# Patient Record
Sex: Female | Born: 1940
Health system: Southern US, Community
[De-identification: ages and names within clinical notes are randomized; demographics above are authoritative.]

## PROBLEM LIST (undated history)

## (undated) DIAGNOSIS — M1990 Unspecified osteoarthritis, unspecified site: Secondary | ICD-10-CM

## (undated) DIAGNOSIS — D649 Anemia, unspecified: Secondary | ICD-10-CM

## (undated) HISTORY — PX: EYE SURGERY: SHX253

## (undated) HISTORY — PX: TOTAL ABDOMINAL HYSTERECTOMY: SHX209

## (undated) HISTORY — PX: MOHS SURGERY: SHX181

---

## 1994-02-15 DIAGNOSIS — C4491 Basal cell carcinoma of skin, unspecified: Secondary | ICD-10-CM

## 1994-02-15 HISTORY — DX: Basal cell carcinoma of skin, unspecified: C44.91

## 1999-12-08 ENCOUNTER — Other Ambulatory Visit: Admission: RE | Admit: 1999-12-08 | Discharge: 1999-12-08 | Payer: Self-pay | Admitting: Gynecology

## 2002-12-15 ENCOUNTER — Other Ambulatory Visit: Admission: RE | Admit: 2002-12-15 | Discharge: 2002-12-15 | Payer: Self-pay | Admitting: Gynecology

## 2009-09-22 DIAGNOSIS — C4492 Squamous cell carcinoma of skin, unspecified: Secondary | ICD-10-CM

## 2009-09-22 HISTORY — DX: Squamous cell carcinoma of skin, unspecified: C44.92

## 2010-06-07 ENCOUNTER — Other Ambulatory Visit: Payer: Self-pay | Admitting: Gynecology

## 2010-06-07 DIAGNOSIS — R928 Other abnormal and inconclusive findings on diagnostic imaging of breast: Secondary | ICD-10-CM

## 2010-06-10 ENCOUNTER — Ambulatory Visit
Admission: RE | Admit: 2010-06-10 | Discharge: 2010-06-10 | Disposition: A | Discharge status: Home / Self Care | Payer: Medicare Other | Source: Ambulatory Visit | Attending: Gynecology | Admitting: Gynecology

## 2010-06-10 DIAGNOSIS — R928 Other abnormal and inconclusive findings on diagnostic imaging of breast: Secondary | ICD-10-CM

## 2011-01-24 ENCOUNTER — Other Ambulatory Visit (HOSPITAL_COMMUNITY): Payer: Self-pay | Admitting: Family Medicine

## 2011-01-24 DIAGNOSIS — Z139 Encounter for screening, unspecified: Secondary | ICD-10-CM

## 2011-01-27 ENCOUNTER — Ambulatory Visit (HOSPITAL_COMMUNITY)
Admission: RE | Admit: 2011-01-27 | Discharge: 2011-01-27 | Disposition: A | Discharge status: Home / Self Care | Payer: Medicare Other | Source: Ambulatory Visit | Attending: Family Medicine | Admitting: Family Medicine

## 2011-01-27 DIAGNOSIS — M899 Disorder of bone, unspecified: Secondary | ICD-10-CM | POA: Insufficient documentation

## 2011-01-27 DIAGNOSIS — Z13820 Encounter for screening for osteoporosis: Secondary | ICD-10-CM | POA: Insufficient documentation

## 2011-01-27 DIAGNOSIS — Z139 Encounter for screening, unspecified: Secondary | ICD-10-CM

## 2011-01-27 DIAGNOSIS — Z780 Asymptomatic menopausal state: Secondary | ICD-10-CM | POA: Insufficient documentation

## 2014-07-01 ENCOUNTER — Other Ambulatory Visit (HOSPITAL_COMMUNITY): Payer: Self-pay | Admitting: Family Medicine

## 2014-07-01 DIAGNOSIS — M8580 Other specified disorders of bone density and structure, unspecified site: Secondary | ICD-10-CM

## 2014-07-07 ENCOUNTER — Ambulatory Visit (HOSPITAL_COMMUNITY)
Admission: RE | Admit: 2014-07-07 | Discharge: 2014-07-07 | Disposition: A | Discharge status: Home / Self Care | Payer: Commercial Managed Care - HMO | Source: Ambulatory Visit | Attending: Family Medicine | Admitting: Family Medicine

## 2014-07-07 DIAGNOSIS — M8580 Other specified disorders of bone density and structure, unspecified site: Secondary | ICD-10-CM | POA: Diagnosis present

## 2015-06-01 DIAGNOSIS — H5203 Hypermetropia, bilateral: Secondary | ICD-10-CM | POA: Diagnosis not present

## 2015-06-01 DIAGNOSIS — I10 Essential (primary) hypertension: Secondary | ICD-10-CM | POA: Diagnosis not present

## 2015-06-01 DIAGNOSIS — H35033 Hypertensive retinopathy, bilateral: Secondary | ICD-10-CM | POA: Diagnosis not present

## 2015-06-01 DIAGNOSIS — H52223 Regular astigmatism, bilateral: Secondary | ICD-10-CM | POA: Diagnosis not present

## 2015-07-05 DIAGNOSIS — I10 Essential (primary) hypertension: Secondary | ICD-10-CM | POA: Diagnosis not present

## 2015-07-05 DIAGNOSIS — R946 Abnormal results of thyroid function studies: Secondary | ICD-10-CM | POA: Diagnosis not present

## 2015-07-05 DIAGNOSIS — E782 Mixed hyperlipidemia: Secondary | ICD-10-CM | POA: Diagnosis not present

## 2015-07-05 DIAGNOSIS — Z0000 Encounter for general adult medical examination without abnormal findings: Secondary | ICD-10-CM | POA: Diagnosis not present

## 2015-07-05 DIAGNOSIS — Z1389 Encounter for screening for other disorder: Secondary | ICD-10-CM | POA: Diagnosis not present

## 2015-07-05 DIAGNOSIS — Z6820 Body mass index (BMI) 20.0-20.9, adult: Secondary | ICD-10-CM | POA: Diagnosis not present

## 2015-08-05 ENCOUNTER — Ambulatory Visit (HOSPITAL_COMMUNITY)
Admission: RE | Admit: 2015-08-05 | Discharge: 2015-08-05 | Disposition: A | Discharge status: Home / Self Care | Payer: PPO | Source: Ambulatory Visit | Attending: Family Medicine | Admitting: Family Medicine

## 2015-08-05 ENCOUNTER — Other Ambulatory Visit (HOSPITAL_COMMUNITY): Payer: Self-pay | Admitting: Family Medicine

## 2015-08-05 DIAGNOSIS — M79672 Pain in left foot: Secondary | ICD-10-CM | POA: Diagnosis not present

## 2015-08-05 DIAGNOSIS — Z1389 Encounter for screening for other disorder: Secondary | ICD-10-CM | POA: Diagnosis not present

## 2015-08-05 DIAGNOSIS — Z6820 Body mass index (BMI) 20.0-20.9, adult: Secondary | ICD-10-CM | POA: Diagnosis not present

## 2015-08-17 DIAGNOSIS — R300 Dysuria: Secondary | ICD-10-CM | POA: Diagnosis not present

## 2015-08-17 DIAGNOSIS — Z1231 Encounter for screening mammogram for malignant neoplasm of breast: Secondary | ICD-10-CM | POA: Diagnosis not present

## 2015-08-17 DIAGNOSIS — N393 Stress incontinence (female) (male): Secondary | ICD-10-CM | POA: Diagnosis not present

## 2015-08-17 DIAGNOSIS — Z1212 Encounter for screening for malignant neoplasm of rectum: Secondary | ICD-10-CM | POA: Diagnosis not present

## 2015-08-17 DIAGNOSIS — Z01419 Encounter for gynecological examination (general) (routine) without abnormal findings: Secondary | ICD-10-CM | POA: Diagnosis not present

## 2015-08-17 DIAGNOSIS — N951 Menopausal and female climacteric states: Secondary | ICD-10-CM | POA: Diagnosis not present

## 2015-08-25 DIAGNOSIS — H25042 Posterior subcapsular polar age-related cataract, left eye: Secondary | ICD-10-CM | POA: Diagnosis not present

## 2015-08-25 DIAGNOSIS — H25012 Cortical age-related cataract, left eye: Secondary | ICD-10-CM | POA: Diagnosis not present

## 2015-08-25 DIAGNOSIS — I10 Essential (primary) hypertension: Secondary | ICD-10-CM | POA: Diagnosis not present

## 2015-08-25 DIAGNOSIS — H2512 Age-related nuclear cataract, left eye: Secondary | ICD-10-CM | POA: Diagnosis not present

## 2015-09-28 DIAGNOSIS — H2513 Age-related nuclear cataract, bilateral: Secondary | ICD-10-CM | POA: Diagnosis not present

## 2015-09-28 DIAGNOSIS — H2512 Age-related nuclear cataract, left eye: Secondary | ICD-10-CM | POA: Diagnosis not present

## 2015-09-29 DIAGNOSIS — H2511 Age-related nuclear cataract, right eye: Secondary | ICD-10-CM | POA: Diagnosis not present

## 2015-10-05 DIAGNOSIS — H2511 Age-related nuclear cataract, right eye: Secondary | ICD-10-CM | POA: Diagnosis not present

## 2015-10-06 DIAGNOSIS — Z961 Presence of intraocular lens: Secondary | ICD-10-CM | POA: Diagnosis not present

## 2016-06-06 DIAGNOSIS — C44519 Basal cell carcinoma of skin of other part of trunk: Secondary | ICD-10-CM | POA: Diagnosis not present

## 2016-06-06 DIAGNOSIS — D229 Melanocytic nevi, unspecified: Secondary | ICD-10-CM | POA: Diagnosis not present

## 2016-06-06 DIAGNOSIS — C44319 Basal cell carcinoma of skin of other parts of face: Secondary | ICD-10-CM | POA: Diagnosis not present

## 2016-06-06 DIAGNOSIS — L821 Other seborrheic keratosis: Secondary | ICD-10-CM | POA: Diagnosis not present

## 2016-06-06 DIAGNOSIS — Z85828 Personal history of other malignant neoplasm of skin: Secondary | ICD-10-CM | POA: Diagnosis not present

## 2016-07-20 DIAGNOSIS — C44519 Basal cell carcinoma of skin of other part of trunk: Secondary | ICD-10-CM | POA: Diagnosis not present

## 2016-07-20 DIAGNOSIS — C44319 Basal cell carcinoma of skin of other parts of face: Secondary | ICD-10-CM | POA: Diagnosis not present

## 2016-08-09 IMAGING — DX DG FOOT COMPLETE 3+V*L*
3 series · 3 of 3 positions shown · non-contrast
Comparison: None

CLINICAL DATA: Foot pain

EXAM:
LEFT FOOT - COMPLETE 3+ VIEW

[foot ap]
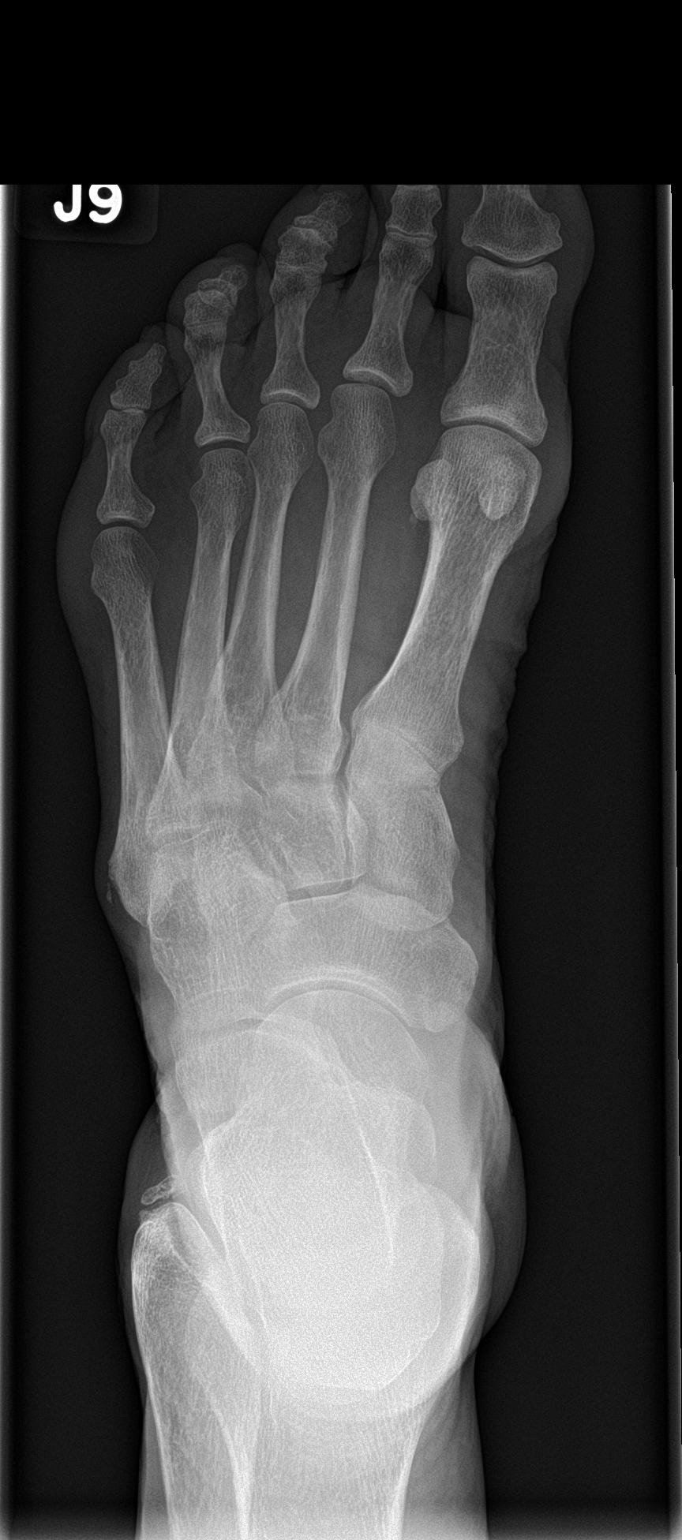

[foot obl]
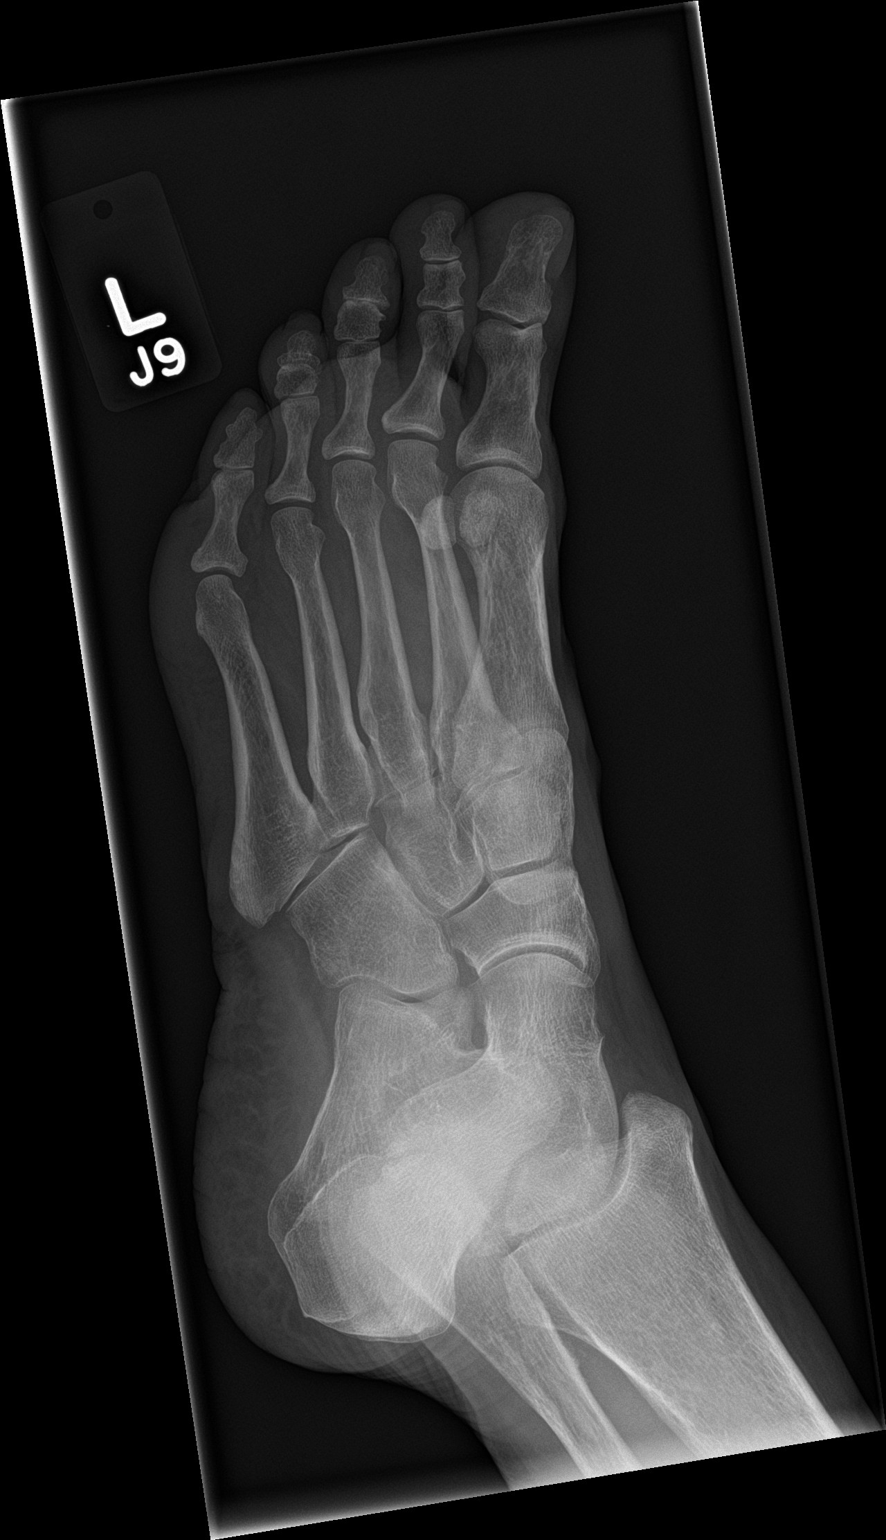

[foot lat]
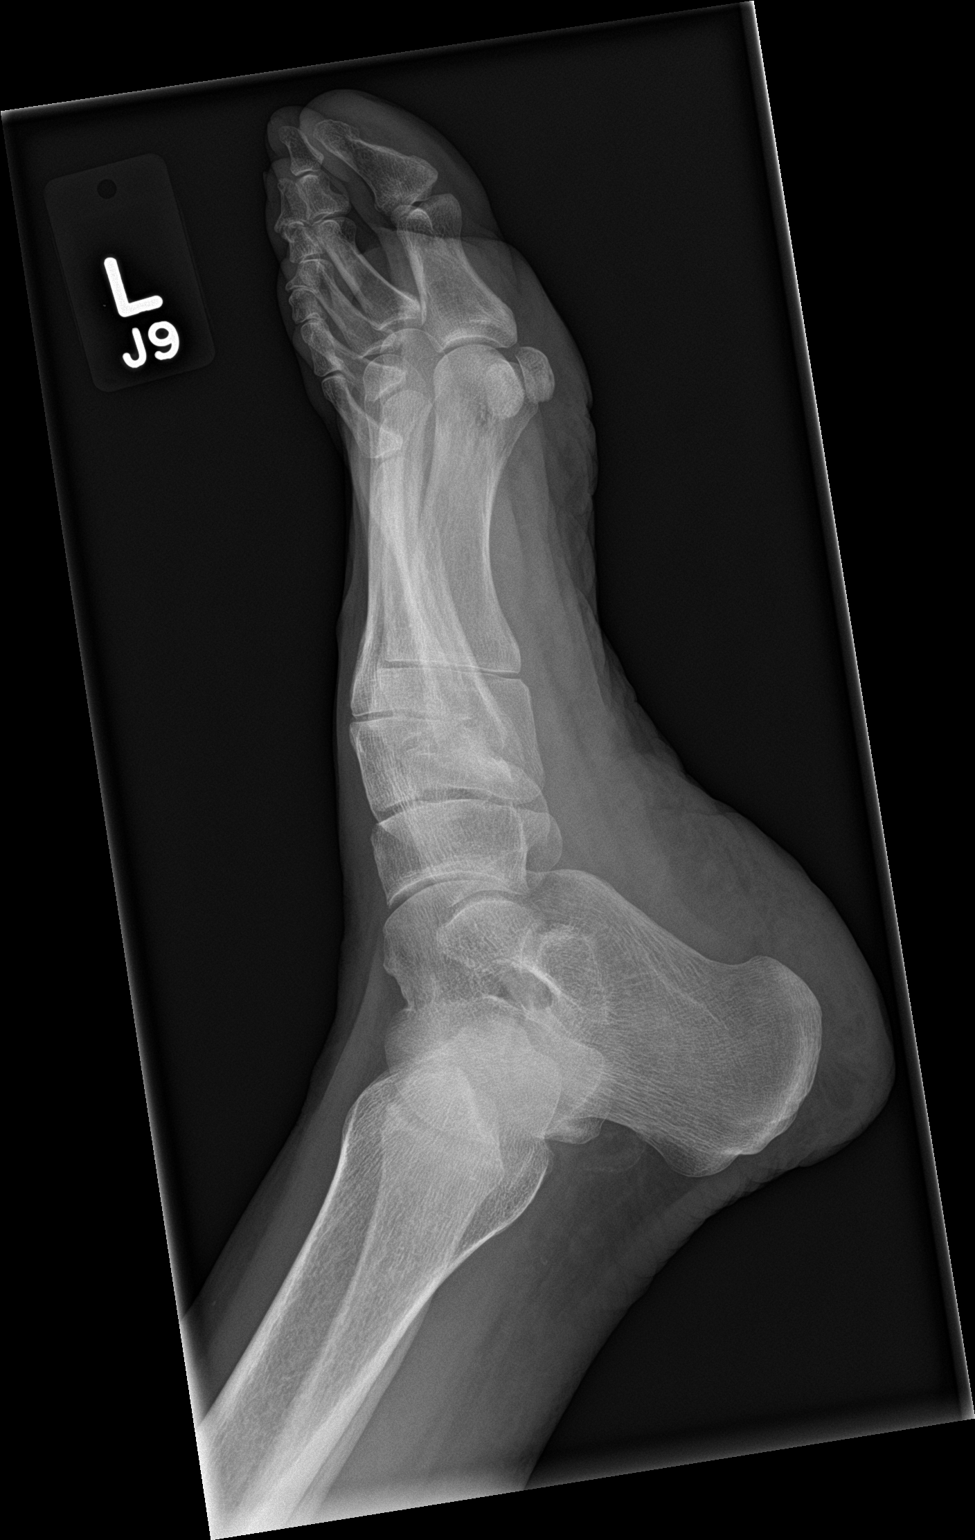

[3 of 3 positions shown; findings below may reference images not displayed]

FINDINGS: There is no evidence of fracture or dislocation. There is no
evidence of arthropathy or other focal bone abnormality. Soft
tissues are unremarkable.
IMPRESSION: Negative.

## 2016-08-21 DIAGNOSIS — Z1231 Encounter for screening mammogram for malignant neoplasm of breast: Secondary | ICD-10-CM | POA: Diagnosis not present

## 2016-08-23 ENCOUNTER — Other Ambulatory Visit: Payer: Self-pay | Admitting: Obstetrics & Gynecology

## 2016-08-23 DIAGNOSIS — R928 Other abnormal and inconclusive findings on diagnostic imaging of breast: Secondary | ICD-10-CM

## 2016-08-29 ENCOUNTER — Ambulatory Visit
Admission: RE | Admit: 2016-08-29 | Discharge: 2016-08-29 | Disposition: A | Discharge status: Home / Self Care | Payer: PPO | Source: Ambulatory Visit | Attending: Obstetrics & Gynecology | Admitting: Obstetrics & Gynecology

## 2016-08-29 DIAGNOSIS — R928 Other abnormal and inconclusive findings on diagnostic imaging of breast: Secondary | ICD-10-CM | POA: Diagnosis not present

## 2016-09-13 DIAGNOSIS — Z6821 Body mass index (BMI) 21.0-21.9, adult: Secondary | ICD-10-CM | POA: Diagnosis not present

## 2016-09-13 DIAGNOSIS — Z0000 Encounter for general adult medical examination without abnormal findings: Secondary | ICD-10-CM | POA: Diagnosis not present

## 2016-09-13 DIAGNOSIS — Z1389 Encounter for screening for other disorder: Secondary | ICD-10-CM | POA: Diagnosis not present

## 2017-01-22 DIAGNOSIS — L821 Other seborrheic keratosis: Secondary | ICD-10-CM | POA: Diagnosis not present

## 2017-01-22 DIAGNOSIS — L570 Actinic keratosis: Secondary | ICD-10-CM | POA: Diagnosis not present

## 2017-01-22 DIAGNOSIS — D229 Melanocytic nevi, unspecified: Secondary | ICD-10-CM | POA: Diagnosis not present

## 2017-06-04 DIAGNOSIS — H524 Presbyopia: Secondary | ICD-10-CM | POA: Diagnosis not present

## 2017-06-04 DIAGNOSIS — H52223 Regular astigmatism, bilateral: Secondary | ICD-10-CM | POA: Diagnosis not present

## 2017-06-04 DIAGNOSIS — H5203 Hypermetropia, bilateral: Secondary | ICD-10-CM | POA: Diagnosis not present

## 2017-08-23 DIAGNOSIS — Z01419 Encounter for gynecological examination (general) (routine) without abnormal findings: Secondary | ICD-10-CM | POA: Diagnosis not present

## 2017-08-23 DIAGNOSIS — Z1231 Encounter for screening mammogram for malignant neoplasm of breast: Secondary | ICD-10-CM | POA: Diagnosis not present

## 2017-09-13 DIAGNOSIS — M5030 Other cervical disc degeneration, unspecified cervical region: Secondary | ICD-10-CM | POA: Diagnosis not present

## 2017-09-13 DIAGNOSIS — R946 Abnormal results of thyroid function studies: Secondary | ICD-10-CM | POA: Diagnosis not present

## 2017-09-13 DIAGNOSIS — Z1389 Encounter for screening for other disorder: Secondary | ICD-10-CM | POA: Diagnosis not present

## 2017-09-13 DIAGNOSIS — Z0001 Encounter for general adult medical examination with abnormal findings: Secondary | ICD-10-CM | POA: Diagnosis not present

## 2017-09-13 DIAGNOSIS — E782 Mixed hyperlipidemia: Secondary | ICD-10-CM | POA: Diagnosis not present

## 2017-09-13 DIAGNOSIS — M542 Cervicalgia: Secondary | ICD-10-CM | POA: Diagnosis not present

## 2017-09-13 DIAGNOSIS — Z681 Body mass index (BMI) 19 or less, adult: Secondary | ICD-10-CM | POA: Diagnosis not present

## 2017-10-09 DIAGNOSIS — D0461 Carcinoma in situ of skin of right upper limb, including shoulder: Secondary | ICD-10-CM | POA: Diagnosis not present

## 2017-10-09 DIAGNOSIS — L570 Actinic keratosis: Secondary | ICD-10-CM | POA: Diagnosis not present

## 2017-10-09 DIAGNOSIS — D485 Neoplasm of uncertain behavior of skin: Secondary | ICD-10-CM | POA: Diagnosis not present

## 2017-10-09 DIAGNOSIS — D0439 Carcinoma in situ of skin of other parts of face: Secondary | ICD-10-CM | POA: Diagnosis not present

## 2017-10-09 DIAGNOSIS — L821 Other seborrheic keratosis: Secondary | ICD-10-CM | POA: Diagnosis not present

## 2017-11-01 DIAGNOSIS — D0461 Carcinoma in situ of skin of right upper limb, including shoulder: Secondary | ICD-10-CM | POA: Diagnosis not present

## 2017-11-01 DIAGNOSIS — D485 Neoplasm of uncertain behavior of skin: Secondary | ICD-10-CM | POA: Diagnosis not present

## 2017-11-01 DIAGNOSIS — L820 Inflamed seborrheic keratosis: Secondary | ICD-10-CM | POA: Diagnosis not present

## 2017-11-01 DIAGNOSIS — D0439 Carcinoma in situ of skin of other parts of face: Secondary | ICD-10-CM | POA: Diagnosis not present

## 2018-02-19 DIAGNOSIS — C44519 Basal cell carcinoma of skin of other part of trunk: Secondary | ICD-10-CM | POA: Diagnosis not present

## 2018-02-19 DIAGNOSIS — L570 Actinic keratosis: Secondary | ICD-10-CM | POA: Diagnosis not present

## 2018-07-03 DIAGNOSIS — R946 Abnormal results of thyroid function studies: Secondary | ICD-10-CM | POA: Diagnosis not present

## 2018-07-03 DIAGNOSIS — E785 Hyperlipidemia, unspecified: Secondary | ICD-10-CM | POA: Diagnosis not present

## 2018-07-03 DIAGNOSIS — Z23 Encounter for immunization: Secondary | ICD-10-CM | POA: Diagnosis not present

## 2018-07-03 DIAGNOSIS — R7301 Impaired fasting glucose: Secondary | ICD-10-CM | POA: Diagnosis not present

## 2018-07-03 DIAGNOSIS — Z1389 Encounter for screening for other disorder: Secondary | ICD-10-CM | POA: Diagnosis not present

## 2018-07-03 DIAGNOSIS — E213 Hyperparathyroidism, unspecified: Secondary | ICD-10-CM | POA: Diagnosis not present

## 2018-07-03 DIAGNOSIS — Z681 Body mass index (BMI) 19 or less, adult: Secondary | ICD-10-CM | POA: Diagnosis not present

## 2018-07-03 DIAGNOSIS — S2232XA Fracture of one rib, left side, initial encounter for closed fracture: Secondary | ICD-10-CM | POA: Diagnosis not present

## 2018-07-03 DIAGNOSIS — Z08 Encounter for follow-up examination after completed treatment for malignant neoplasm: Secondary | ICD-10-CM | POA: Diagnosis not present

## 2018-07-03 DIAGNOSIS — I10 Essential (primary) hypertension: Secondary | ICD-10-CM | POA: Diagnosis not present

## 2018-07-03 DIAGNOSIS — E559 Vitamin D deficiency, unspecified: Secondary | ICD-10-CM | POA: Diagnosis not present

## 2018-07-03 DIAGNOSIS — R5383 Other fatigue: Secondary | ICD-10-CM | POA: Diagnosis not present

## 2018-07-03 DIAGNOSIS — E7800 Pure hypercholesterolemia, unspecified: Secondary | ICD-10-CM | POA: Diagnosis not present

## 2018-07-03 DIAGNOSIS — Z79899 Other long term (current) drug therapy: Secondary | ICD-10-CM | POA: Diagnosis not present

## 2018-07-03 DIAGNOSIS — R030 Elevated blood-pressure reading, without diagnosis of hypertension: Secondary | ICD-10-CM | POA: Diagnosis not present

## 2018-07-03 DIAGNOSIS — Z8521 Personal history of malignant neoplasm of larynx: Secondary | ICD-10-CM | POA: Diagnosis not present

## 2018-07-03 DIAGNOSIS — Z0000 Encounter for general adult medical examination without abnormal findings: Secondary | ICD-10-CM | POA: Diagnosis not present

## 2018-07-03 DIAGNOSIS — Z85118 Personal history of other malignant neoplasm of bronchus and lung: Secondary | ICD-10-CM | POA: Diagnosis not present

## 2018-07-03 DIAGNOSIS — E782 Mixed hyperlipidemia: Secondary | ICD-10-CM | POA: Diagnosis not present

## 2018-07-03 DIAGNOSIS — F17210 Nicotine dependence, cigarettes, uncomplicated: Secondary | ICD-10-CM | POA: Diagnosis not present

## 2018-08-19 DIAGNOSIS — Z6820 Body mass index (BMI) 20.0-20.9, adult: Secondary | ICD-10-CM | POA: Diagnosis not present

## 2018-08-19 DIAGNOSIS — R946 Abnormal results of thyroid function studies: Secondary | ICD-10-CM | POA: Diagnosis not present

## 2018-08-19 DIAGNOSIS — M5030 Other cervical disc degeneration, unspecified cervical region: Secondary | ICD-10-CM | POA: Diagnosis not present

## 2018-08-19 DIAGNOSIS — Z1389 Encounter for screening for other disorder: Secondary | ICD-10-CM | POA: Diagnosis not present

## 2018-08-19 DIAGNOSIS — E7849 Other hyperlipidemia: Secondary | ICD-10-CM | POA: Diagnosis not present

## 2018-10-01 DIAGNOSIS — Z209 Contact with and (suspected) exposure to unspecified communicable disease: Secondary | ICD-10-CM | POA: Diagnosis not present

## 2018-10-01 DIAGNOSIS — R000 Tachycardia, unspecified: Secondary | ICD-10-CM | POA: Diagnosis not present

## 2018-10-01 DIAGNOSIS — I499 Cardiac arrhythmia, unspecified: Secondary | ICD-10-CM | POA: Diagnosis not present

## 2018-10-01 DIAGNOSIS — R0689 Other abnormalities of breathing: Secondary | ICD-10-CM | POA: Diagnosis not present

## 2018-10-01 DIAGNOSIS — I10 Essential (primary) hypertension: Secondary | ICD-10-CM | POA: Diagnosis not present

## 2018-10-02 ENCOUNTER — Emergency Department (HOSPITAL_COMMUNITY): Payer: PPO

## 2018-10-02 ENCOUNTER — Emergency Department (HOSPITAL_COMMUNITY)
Admission: EM | Admit: 2018-10-02 | Discharge: 2018-10-02 | Disposition: A | Discharge status: Home / Self Care | Payer: PPO | Attending: Emergency Medicine | Admitting: Emergency Medicine

## 2018-10-02 ENCOUNTER — Encounter (HOSPITAL_COMMUNITY): Payer: Self-pay

## 2018-10-02 DIAGNOSIS — R002 Palpitations: Secondary | ICD-10-CM | POA: Diagnosis not present

## 2018-10-02 DIAGNOSIS — R079 Chest pain, unspecified: Secondary | ICD-10-CM | POA: Diagnosis not present

## 2018-10-02 LAB — CBC
HCT: 38.5 % (ref 36.0–46.0)
Hemoglobin: 12.7 g/dL (ref 12.0–15.0)
MCH: 29.3 pg (ref 26.0–34.0)
MCHC: 33 g/dL (ref 30.0–36.0)
MCV: 88.9 fL (ref 80.0–100.0)
Platelets: 153 10*3/uL (ref 150–400)
RBC: 4.33 MIL/uL (ref 3.87–5.11)
RDW: 11.9 % (ref 11.5–15.5)
WBC: 6.5 10*3/uL (ref 4.0–10.5)
nRBC: 0 % (ref 0.0–0.2)

## 2018-10-02 LAB — BASIC METABOLIC PANEL
Anion gap: 8 (ref 5–15)
BUN: 14 mg/dL (ref 8–23)
CO2: 22 mmol/L (ref 22–32)
Calcium: 9.6 mg/dL (ref 8.9–10.3)
Chloride: 108 mmol/L (ref 98–111)
Creatinine, Ser: 1.01 mg/dL — ABNORMAL HIGH (ref 0.44–1.00)
GFR calc Af Amer: >60 mL/min (ref >60)
GFR calc non Af Amer: 53 mL/min — ABNORMAL LOW (ref >60)
Glucose, Bld: 103 mg/dL — ABNORMAL HIGH (ref 70–99)
Potassium: 3.9 mmol/L (ref 3.5–5.1)
Sodium: 138 mmol/L (ref 135–145)

## 2018-10-02 LAB — TROPONIN I (HIGH SENSITIVITY): Troponin I (High Sensitivity): 10 ng/L (ref <18)

## 2018-10-02 NOTE — ED Provider Notes (Signed)
[facility] EMERGENCY DEPARTMENT Provider Note  CSN: [id number] Arrival date & time: 10/02/18 0102  Chief Complaint(s) Palpitations  HPI Carolyn Massey is a 78 y.o. female   The history is provided by the patient.  Palpitations Palpitations quality:  Regular Onset quality:  At rest Duration:  45 minutes Timing:  Rare Progression:  Resolved Chronicity:  Recurrent (occured once prior) Context comment:  Was lying down to go to sleep when it started Relieved by: spontaneously. Worsened by:  Nothing Associated symptoms: no back pain, no chest pain, no chest pressure, no cough, no diaphoresis, no dizziness, no lower extremity edema, no malaise/fatigue, no nausea, no numbness, no orthopnea, no shortness of breath, no syncope, no vomiting and no weakness   Risk factors: no diabetes mellitus, no heart disease, no hx of atrial fibrillation, no hx of PE, no hx of thyroid disease, no hypercoagulable state, no hyperthyroidism, no OTC sinus medications and no stress     Past Medical History History reviewed. No pertinent past medical history. There are no active problems to display for this patient.  Home Medication(s) Prior to Admission medications   Not on File                                                                                                                                    Past Surgical History History reviewed. No pertinent surgical history. Family History No family history on file.  Social History Social History   Tobacco Use  . Smoking status: Never Smoker  . Smokeless tobacco: Never Used  Substance Use Topics  . Alcohol use: Never    Frequency: Never  . Drug use: Never   Allergies Patient has no known allergies.  Review of Systems Review of Systems  Constitutional: Negative for diaphoresis and malaise/fatigue.  Respiratory: Negative for cough and shortness of breath.   Cardiovascular: Positive for palpitations. Negative for chest  pain, orthopnea and syncope.  Gastrointestinal: Negative for nausea and vomiting.  Musculoskeletal: Negative for back pain.  Neurological: Negative for dizziness, weakness and numbness.   All other systems are reviewed and are negative for acute change except as noted in the HPI  Physical Exam Vital Signs  I have reviewed the triage vital signs BP (!) 142/77   Pulse 98   Temp 98.6 F (37 C)   Resp (!) 27   SpO2 98%   Physical Exam Vitals signs reviewed.  Constitutional:      General: She is not in acute distress.    Appearance: She is well-developed. She is not diaphoretic.  HENT:     Head: Normocephalic and atraumatic.     Nose: Nose normal.  Eyes:     General: No scleral icterus.       Right eye: No discharge.        Left eye: No discharge.     Conjunctiva/sclera: Conjunctivae normal.     Pupils: Pupils  are equal, round, and reactive to light.  Neck:     Musculoskeletal: Normal range of motion and neck supple.  Cardiovascular:     Rate and Rhythm: Normal rate and regular rhythm.     Heart sounds: No murmur. No friction rub. No gallop.   Pulmonary:     Effort: Pulmonary effort is normal. No respiratory distress.     Breath sounds: Normal breath sounds. No stridor. No rales.  Abdominal:     General: There is no distension.     Palpations: Abdomen is soft.     Tenderness: There is no abdominal tenderness.  Musculoskeletal:        General: No tenderness.  Skin:    General: Skin is warm and dry.     Findings: No erythema or rash.  Neurological:     Mental Status: She is alert and oriented to person, place, and time.     ED Results and Treatments Labs (all labs ordered are listed, but only abnormal results are displayed) Labs Reviewed  BASIC METABOLIC PANEL - Abnormal; Notable for the following components:      Result Value   Glucose, Bld 103 (*)    Creatinine, Ser 1.01 (*)    GFR calc non Af Amer 53 (*)    All other components within normal limits  CBC   TROPONIN I (HIGH SENSITIVITY)                                                                                                                         EKG  EKG Interpretation  Date/Time:  Wednesday October 02 2018 01:12:44 EDT Ventricular Rate:  96 PR Interval:  144 QRS Duration: 78 QT Interval:  340 QTC Calculation: 429 R Axis:   55 Text Interpretation:  Normal sinus rhythm Nonspecific ST abnormality Abnormal ECG NO STEMI. No old tracing to compare Confirmed by Addison Lank [phone]) on 10/02/2018 1:48:15 AM      Radiology Dg Chest 2 View  Result Date: 10/02/2018 CLINICAL DATA:  78 y/o  F; heart beating fast. Chest pain. EXAM: CHEST - 2 VIEW COMPARISON:  None. FINDINGS: Normal cardiac silhouette. Aortic atherosclerosis with calcification. Clear lungs. No pleural effusion or pneumothorax. No acute osseous abnormality is evident. IMPRESSION: No acute pulmonary process identified. Electronically Signed   By: Kristine Garbe M.D.   On: 10/02/2018 01:49    Pertinent labs & imaging results that were available during my care of the patient were reviewed by me and considered in my medical decision making (see chart for details).  Medications Ordered in ED Medications - No data to display  Procedures Procedures  (including critical care time)  Medical Decision Making / ED Course I have reviewed the nursing notes for this encounter and the patient's prior records (if available in EHR or on provided paperwork).   Carolyn Massey was evaluated in Emergency Department on 10/02/2018 for the symptoms described in the history of present illness. She was evaluated in the context of the global COVID-19 pandemic, which necessitated consideration that the patient might be at risk for infection with the SARS-CoV-2 virus that causes COVID-19. Institutional protocols and  algorithms that pertain to the evaluation of patients at risk for COVID-19 are in a state of rapid change based on information released by regulatory bodies including the CDC and federal and state organizations. These policies and algorithms were followed during the patient's care in the ED.  Elderly female who presents with palpitation. No associated chest pain or shortness of breath. No recent fevers or infections. EKG without acute ischemic changes or evidence of pericarditis. Troponin more than 3 hours after incident was negative.  No need for additional cardiac markers at this time.  Review of EMS strip notable for frequent PACs versus PAF.  Doubt ACS, PE.   Labs without leukocytosis or anemia.  No electrolyte derangements.  Mild renal insufficiency which may be related to dehydration.  Chest x-ray without evidence suggestive of pneumonia, pneumothorax, pneumomediastinum.  No abnormal contour of the mediastinum to suggest dissection. No evidence of acute injuries.  Recommended close follow-up with PCP and cardiology.  The patient appears reasonably screened and/or stabilized for discharge and I doubt any other medical condition or other [facility] requiring further screening, evaluation, or treatment in the ED at this time prior to discharge.  The patient is safe for discharge with strict return precautions.       Final Clinical Impression(s) / ED Diagnoses Final diagnoses:  Palpitations     The patient appears reasonably screened and/or stabilized for discharge and I doubt any other medical condition or other [facility] requiring further screening, evaluation, or treatment in the ED at this time prior to discharge.  Disposition: Discharge  Condition: Good  I have discussed the results, Dx and Tx plan with the patient who expressed understanding and agree(s) with the plan. Discharge instructions discussed at great length. The patient was given strict return precautions who verbalized  understanding of the instructions. No further questions at time of discharge.    ED Discharge Orders    None       Follow Up: Sharilyn Sites, [address] [address] [phone]  Schedule an appointment as soon as possible for a visit  For close follow up to assess for possible cardiac stress test and/or holter monitor  [address] [place] [id number] [phone]       This chart was dictated using voice recognition software.  Despite best efforts to proofread,  errors can occur which can change the documentation meaning.   Fatima Blank, MD 10/02/18 [phone]

## 2018-10-02 NOTE — ED Notes (Signed)
[phone] June Daughter - Call for updates

## 2018-10-02 NOTE — ED Notes (Signed)
Discharge instructions discussed with pt. Pt verbalized understanding. Pt stable and ambulatory. No signature pad available. 

## 2018-10-02 NOTE — ED Triage Notes (Signed)
Pt states she began to feel like her heart was beating fast, called 911 and refused transport because she did not want to go to [facility], palpations have resolved, denies SOB, or n/v

## 2018-10-03 DIAGNOSIS — Z681 Body mass index (BMI) 19 or less, adult: Secondary | ICD-10-CM | POA: Diagnosis not present

## 2018-10-03 DIAGNOSIS — I471 Supraventricular tachycardia: Secondary | ICD-10-CM | POA: Diagnosis not present

## 2018-10-03 DIAGNOSIS — R000 Tachycardia, unspecified: Secondary | ICD-10-CM | POA: Diagnosis not present

## 2018-10-03 DIAGNOSIS — Z1389 Encounter for screening for other disorder: Secondary | ICD-10-CM | POA: Diagnosis not present

## 2019-04-23 DIAGNOSIS — L570 Actinic keratosis: Secondary | ICD-10-CM | POA: Diagnosis not present

## 2019-04-23 DIAGNOSIS — D0471 Carcinoma in situ of skin of right lower limb, including hip: Secondary | ICD-10-CM | POA: Diagnosis not present

## 2019-04-23 DIAGNOSIS — D0462 Carcinoma in situ of skin of left upper limb, including shoulder: Secondary | ICD-10-CM | POA: Diagnosis not present

## 2019-04-23 DIAGNOSIS — C44519 Basal cell carcinoma of skin of other part of trunk: Secondary | ICD-10-CM | POA: Diagnosis not present

## 2019-04-23 DIAGNOSIS — C44612 Basal cell carcinoma of skin of right upper limb, including shoulder: Secondary | ICD-10-CM | POA: Diagnosis not present

## 2019-05-15 DIAGNOSIS — D0462 Carcinoma in situ of skin of left upper limb, including shoulder: Secondary | ICD-10-CM | POA: Diagnosis not present

## 2019-05-15 DIAGNOSIS — C44519 Basal cell carcinoma of skin of other part of trunk: Secondary | ICD-10-CM | POA: Diagnosis not present

## 2019-05-15 DIAGNOSIS — D0471 Carcinoma in situ of skin of right lower limb, including hip: Secondary | ICD-10-CM | POA: Diagnosis not present

## 2019-06-18 ENCOUNTER — Encounter: Payer: Self-pay | Admitting: *Deleted

## 2019-06-18 DIAGNOSIS — Z681 Body mass index (BMI) 19 or less, adult: Secondary | ICD-10-CM | POA: Diagnosis not present

## 2019-06-18 DIAGNOSIS — L03114 Cellulitis of left upper limb: Secondary | ICD-10-CM | POA: Diagnosis not present

## 2019-06-18 DIAGNOSIS — I471 Supraventricular tachycardia: Secondary | ICD-10-CM | POA: Diagnosis not present

## 2019-06-18 DIAGNOSIS — Z1389 Encounter for screening for other disorder: Secondary | ICD-10-CM | POA: Diagnosis not present

## 2019-06-23 ENCOUNTER — Other Ambulatory Visit: Payer: Self-pay

## 2019-06-23 ENCOUNTER — Ambulatory Visit (INDEPENDENT_AMBULATORY_CARE_PROVIDER_SITE_OTHER): Payer: PPO | Admitting: Dermatology

## 2019-06-23 ENCOUNTER — Encounter: Payer: Self-pay | Admitting: Dermatology

## 2019-06-23 DIAGNOSIS — L570 Actinic keratosis: Secondary | ICD-10-CM

## 2019-06-25 ENCOUNTER — Encounter: Payer: Self-pay | Admitting: Dermatology

## 2019-06-25 NOTE — Progress Notes (Signed)
   Follow-Up Visit   Subjective  Carolyn Massey is a 79 y.o. female who presents for the following: Follow-up (here for 5 week follow-up. Had multiple places treated during her last visit. All healed well and no new concerns.).  Follow-up skin cancers Location: Arm leg and face Duration: Several months Quality: allCLEAR Associated Signs/Symptoms: Modifying Factors:  Severity:  Timing: Context: Some small crusts on face  The following portions of the chart were reviewed this encounter and updated as appropriate:     Objective  Well appearing patient in no apparent distress; mood and affect are within normal limits.  A focused examination was performed including head, neck, arms, legs. Relevant physical exam findings are noted in the Assessment and Plan.  All CIS sites clear, moderate PIH. Small crusts face = AKs. Assessment & Plan  AK (actinic keratosis) Head - Anterior (Face)  Watch. Annual skin check, sooner prn.

## 2019-07-18 DIAGNOSIS — R946 Abnormal results of thyroid function studies: Secondary | ICD-10-CM | POA: Diagnosis not present

## 2019-07-18 DIAGNOSIS — Z0000 Encounter for general adult medical examination without abnormal findings: Secondary | ICD-10-CM | POA: Diagnosis not present

## 2019-07-18 DIAGNOSIS — I471 Supraventricular tachycardia: Secondary | ICD-10-CM | POA: Diagnosis not present

## 2019-07-18 DIAGNOSIS — Z681 Body mass index (BMI) 19 or less, adult: Secondary | ICD-10-CM | POA: Diagnosis not present

## 2019-07-18 DIAGNOSIS — E7849 Other hyperlipidemia: Secondary | ICD-10-CM | POA: Diagnosis not present

## 2019-07-18 DIAGNOSIS — M5030 Other cervical disc degeneration, unspecified cervical region: Secondary | ICD-10-CM | POA: Diagnosis not present

## 2019-07-18 DIAGNOSIS — Z1389 Encounter for screening for other disorder: Secondary | ICD-10-CM | POA: Diagnosis not present

## 2019-09-16 DIAGNOSIS — Z01419 Encounter for gynecological examination (general) (routine) without abnormal findings: Secondary | ICD-10-CM | POA: Diagnosis not present

## 2019-09-16 DIAGNOSIS — Z1231 Encounter for screening mammogram for malignant neoplasm of breast: Secondary | ICD-10-CM | POA: Diagnosis not present

## 2019-09-16 DIAGNOSIS — Z6820 Body mass index (BMI) 20.0-20.9, adult: Secondary | ICD-10-CM | POA: Diagnosis not present

## 2019-10-28 DIAGNOSIS — E2839 Other primary ovarian failure: Secondary | ICD-10-CM | POA: Diagnosis not present

## 2019-10-28 DIAGNOSIS — Z0389 Encounter for observation for other suspected diseases and conditions ruled out: Secondary | ICD-10-CM | POA: Diagnosis not present

## 2020-04-05 ENCOUNTER — Ambulatory Visit: Payer: PPO | Admitting: Dermatology

## 2020-04-15 DIAGNOSIS — Z23 Encounter for immunization: Secondary | ICD-10-CM | POA: Diagnosis not present

## 2020-06-15 ENCOUNTER — Other Ambulatory Visit: Payer: Self-pay

## 2020-06-15 ENCOUNTER — Encounter: Payer: Self-pay | Admitting: Dermatology

## 2020-06-15 ENCOUNTER — Ambulatory Visit: Payer: PPO | Admitting: Dermatology

## 2020-06-15 DIAGNOSIS — L821 Other seborrheic keratosis: Secondary | ICD-10-CM

## 2020-06-15 DIAGNOSIS — D485 Neoplasm of uncertain behavior of skin: Secondary | ICD-10-CM

## 2020-06-15 DIAGNOSIS — Z85828 Personal history of other malignant neoplasm of skin: Secondary | ICD-10-CM | POA: Diagnosis not present

## 2020-06-15 DIAGNOSIS — C44619 Basal cell carcinoma of skin of left upper limb, including shoulder: Secondary | ICD-10-CM | POA: Diagnosis not present

## 2020-06-15 DIAGNOSIS — C4492 Squamous cell carcinoma of skin, unspecified: Secondary | ICD-10-CM

## 2020-06-15 DIAGNOSIS — L570 Actinic keratosis: Secondary | ICD-10-CM

## 2020-06-15 DIAGNOSIS — Z1283 Encounter for screening for malignant neoplasm of skin: Secondary | ICD-10-CM

## 2020-06-15 DIAGNOSIS — D0439 Carcinoma in situ of skin of other parts of face: Secondary | ICD-10-CM | POA: Diagnosis not present

## 2020-06-15 DIAGNOSIS — C4491 Basal cell carcinoma of skin, unspecified: Secondary | ICD-10-CM

## 2020-06-15 HISTORY — DX: Squamous cell carcinoma of skin, unspecified: C44.92

## 2020-06-15 HISTORY — DX: Basal cell carcinoma of skin, unspecified: C44.91

## 2020-06-15 NOTE — Patient Instructions (Signed)

## 2020-06-24 ENCOUNTER — Telehealth: Payer: Self-pay

## 2020-06-24 NOTE — Telephone Encounter (Signed)
Phone call to patient with her pathology results. Patient aware of results.  

## 2020-06-24 NOTE — Telephone Encounter (Signed)
-----   Message from Lavonna Monarch, MD sent at 06/24/2020  6:14 AM EDT ----- Nasal lesion likely cured with biopsy but are may require a little more work.  Schedul 30 minutes with Dr. Darene Lamer.

## 2020-06-26 ENCOUNTER — Encounter: Payer: Self-pay | Admitting: Dermatology

## 2020-06-26 NOTE — Progress Notes (Signed)
Follow-Up Visit   Subjective  Carolyn Massey is a 80 y.o. female who presents for the following: Annual Exam (Full body skin check. Lesion on bridge of nose x year, tender to touch, no bleeding. Lesion on left forearm, bleeds, tender, patient says its growing. Lesions on chest that are painful, red.).  New sore spots left arm, chest, nose Location:  Duration:  Quality:  Associated Signs/Symptoms: Modifying Factors:  Severity:  Timing: Context: History of multiple nonmelanoma skin cancers  Objective  Well appearing patient in no apparent distress; mood and affect are within normal limits. Objective  Right upper chest: No sign residual  Objective  Left Abdomen (side) - Lower: Full body skin check (areas beneath undergarments not fully examined): No atypical pigmented lesions  Objective  Left Forearm - Anterior: Eroded 8 mm pink crust     Objective  Dorsum of Nose: Hornlike 3 mm pink papule on top of 7 mm pearly base     Objective  Mid Back: Multiple 3 to 8 mm brown textured flattopped papules  Objective  Left Breast, Left Forearm - Posterior, Right Breast (2): Raw pink 3 to 5 mm crusts    All sun exposed areas plus back examined.  Plus upper chest.   Assessment & Plan    History of basal cell carcinoma (BCC) Right upper chest  Check as needed change  Screening exam for skin cancer Left Abdomen (side) - Lower  Annual skin examination, encouraged to self examine twice annually.  Neoplasm of uncertain behavior of skin (2) Left Forearm - Anterior  Skin / nail biopsy Type of biopsy: tangential   Informed consent: discussed and consent obtained   Timeout: patient name, date of birth, surgical site, and procedure verified   Procedure prep:  Patient was prepped and draped in usual sterile fashion (Non sterile) Prep type:  Chlorhexidine Anesthesia: the lesion was anesthetized in a standard fashion   Anesthetic:  1% lidocaine w/ epinephrine  1-100,000 local infiltration Instrument used: flexible razor blade   Outcome: patient tolerated procedure well   Post-procedure details: wound care instructions given    Specimen 1 - Surgical pathology Differential Diagnosis: r/o atypia  Check Margins: No  Dorsum of Nose  Skin / nail biopsy Type of biopsy: tangential   Informed consent: discussed and consent obtained   Timeout: patient name, date of birth, surgical site, and procedure verified   Procedure prep:  Patient was prepped and draped in usual sterile fashion (Non sterile) Prep type:  Chlorhexidine Anesthesia: the lesion was anesthetized in a standard fashion   Anesthetic:  1% lidocaine w/ epinephrine 1-100,000 local infiltration Instrument used: flexible razor blade   Outcome: patient tolerated procedure well   Post-procedure details: wound care instructions given    Destruction of lesion Complexity: simple   Destruction method: electrodesiccation and curettage   Informed consent: discussed and consent obtained   Timeout:  patient name, date of birth, surgical site, and procedure verified Anesthesia: the lesion was anesthetized in a standard fashion   Anesthetic:  1% lidocaine w/ epinephrine 1-100,000 local infiltration Curettage performed in three different directions: Yes   Curettage cycles:  3 Lesion length (cm):  0.7 Lesion width (cm):  0.7 Margin per side (cm):  0 Final wound size (cm):  0.7 Hemostasis achieved with:  aluminum chloride Outcome: patient tolerated procedure well with no complications   Post-procedure details: wound care instructions given    Specimen 2 - Surgical pathology Differential Diagnosis: Tumor of unknown Curetx1 and cautery  Check Margins: No  Seborrheic keratosis Mid Back  Leave if stable  AK (actinic keratosis) (4) Left Forearm - Posterior; Left Breast; Right Breast (2)  Destruction of lesion - Left Breast, Left Forearm - Posterior, Right Breast (2) Complexity: simple    Destruction method: cryotherapy   Informed consent: discussed and consent obtained   Timeout:  patient name, date of birth, surgical site, and procedure verified Lesion destroyed using liquid nitrogen: Yes   Cryotherapy cycles:  5 Outcome: patient tolerated procedure well with no complications   Post-procedure details: wound care instructions given        I, Lavonna Monarch, MD, have reviewed all documentation for this visit.  The documentation on 06/26/20 for the exam, diagnosis, procedures, and orders are all accurate and complete.

## 2020-08-12 ENCOUNTER — Encounter: Payer: Self-pay | Admitting: Dermatology

## 2020-08-12 ENCOUNTER — Ambulatory Visit (INDEPENDENT_AMBULATORY_CARE_PROVIDER_SITE_OTHER): Payer: PPO | Admitting: Dermatology

## 2020-08-12 ENCOUNTER — Other Ambulatory Visit: Payer: Self-pay

## 2020-08-12 DIAGNOSIS — C4491 Basal cell carcinoma of skin, unspecified: Secondary | ICD-10-CM

## 2020-08-12 DIAGNOSIS — D0439 Carcinoma in situ of skin of other parts of face: Secondary | ICD-10-CM

## 2020-08-12 DIAGNOSIS — D099 Carcinoma in situ, unspecified: Secondary | ICD-10-CM

## 2020-08-12 DIAGNOSIS — C44619 Basal cell carcinoma of skin of left upper limb, including shoulder: Secondary | ICD-10-CM | POA: Diagnosis not present

## 2020-08-12 DIAGNOSIS — D485 Neoplasm of uncertain behavior of skin: Secondary | ICD-10-CM

## 2020-08-12 NOTE — Patient Instructions (Signed)

## 2020-08-20 DIAGNOSIS — R946 Abnormal results of thyroid function studies: Secondary | ICD-10-CM | POA: Diagnosis not present

## 2020-08-20 DIAGNOSIS — Z0000 Encounter for general adult medical examination without abnormal findings: Secondary | ICD-10-CM | POA: Diagnosis not present

## 2020-08-20 DIAGNOSIS — C4491 Basal cell carcinoma of skin, unspecified: Secondary | ICD-10-CM | POA: Diagnosis not present

## 2020-08-20 DIAGNOSIS — E7849 Other hyperlipidemia: Secondary | ICD-10-CM | POA: Diagnosis not present

## 2020-08-20 DIAGNOSIS — I471 Supraventricular tachycardia: Secondary | ICD-10-CM | POA: Diagnosis not present

## 2020-08-20 DIAGNOSIS — Z681 Body mass index (BMI) 19 or less, adult: Secondary | ICD-10-CM | POA: Diagnosis not present

## 2020-08-20 DIAGNOSIS — M5030 Other cervical disc degeneration, unspecified cervical region: Secondary | ICD-10-CM | POA: Diagnosis not present

## 2020-08-20 DIAGNOSIS — L570 Actinic keratosis: Secondary | ICD-10-CM | POA: Diagnosis not present

## 2020-08-20 DIAGNOSIS — Z1389 Encounter for screening for other disorder: Secondary | ICD-10-CM | POA: Diagnosis not present

## 2020-08-20 DIAGNOSIS — Z1331 Encounter for screening for depression: Secondary | ICD-10-CM | POA: Diagnosis not present

## 2020-08-24 ENCOUNTER — Ambulatory Visit: Payer: PPO | Admitting: Dermatology

## 2020-08-26 ENCOUNTER — Encounter: Payer: Self-pay | Admitting: Dermatology

## 2020-08-26 NOTE — Progress Notes (Signed)
Follow-Up Visit   Subjective  Carolyn Massey is a 80 y.o. female who presents for the following: Procedure (Here for treatment left forearm anterior- bcc x 1 & dorsum of nose- cis x 1).  2 biopsy-proven carcinoma in situ plus possible recurrence of CIS left cheek Location:  Duration:  Quality:  Associated Signs/Symptoms: Modifying Factors:  Severity:  Timing: Context:   Objective  Well appearing patient in no apparent distress; mood and affect are within normal limits. Left Forearm - Anterior Lesion identified by Dr. and nurse in room.    Dorsum of Nose Lesion identified by Dr. and nurse in room.    Left Parotid Area Residual 1.1cm flat pink crust at site adjacent to previous carcinoma in situ left cheek       A focused examination was performed including face and arm.. Relevant physical exam findings are noted in the Assessment and Plan.   Assessment & Plan    Basal cell carcinoma (BCC), unspecified site Left Forearm - Anterior  Destruction of lesion Complexity: simple   Destruction method: electrodesiccation and curettage   Informed consent: discussed and consent obtained   Timeout:  patient name, date of birth, surgical site, and procedure verified Anesthesia: the lesion was anesthetized in a standard fashion   Anesthetic:  1% lidocaine w/ epinephrine 1-100,000 local infiltration Curettage performed in three different directions: Yes   Electrodesiccation performed over the curetted area: Yes   Curettage cycles:  3 Lesion length (cm):  2 Lesion width (cm):  2 Margin per side (cm):  0 Final wound size (cm):  2 Hemostasis achieved with:  ferric subsulfate Outcome: patient tolerated procedure well with no complications   Additional details:  Wound innoculated with 5 fluorouracil solution.  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  Destruction of lesion Complexity: simple   Destruction method: electrodesiccation and curettage    Informed consent: discussed and consent obtained   Timeout:  patient name, date of birth, surgical site, and procedure verified Anesthesia: the lesion was anesthetized in a standard fashion   Anesthetic:  1% lidocaine w/ epinephrine 1-100,000 local infiltration Curettage performed in three different directions: Yes   Electrodesiccation performed over the curetted area: Yes   Curettage cycles:  3 Lesion length (cm):  1 Lesion width (cm):  1 Margin per side (cm):  0 Final wound size (cm):  1 Hemostasis achieved with:  ferric subsulfate Outcome: patient tolerated procedure well with no complications   Additional details:  Wound innoculated with 5 fluorouracil solution.  Carcinoma in situ of skin of other part of face Left Parotid Area  After shave biopsy the base of the lesion was curettaged and inoculated with 5-fluorouracil.  Skin / nail biopsy - Left Parotid Area Type of biopsy: tangential   Informed consent: discussed and consent obtained   Timeout: patient name, date of birth, surgical site, and procedure verified   Anesthesia: the lesion was anesthetized in a standard fashion   Anesthetic:  1% lidocaine w/ epinephrine 1-100,000 local infiltration Instrument used: flexible razor blade   Hemostasis achieved with: aluminum chloride and electrodesiccation   Outcome: patient tolerated procedure well   Post-procedure details: wound care instructions given    Destruction of lesion - Left Parotid Area Complexity: simple   Destruction method: electrodesiccation and curettage   Informed consent: discussed and consent obtained   Timeout:  patient name, date of birth, surgical site, and procedure verified Anesthesia: the lesion was anesthetized in a standard fashion   Anesthetic:  1% lidocaine w/ epinephrine 1-100,000 local infiltration Curettage performed in three different directions: Yes   Electrodesiccation performed over the curetted area: Yes   Curettage cycles:  3 Lesion  length (cm):  1.7 Lesion width (cm):  1.7 Margin per side (cm):  0 Final wound size (cm):  1.7 Hemostasis achieved with:  ferric subsulfate Outcome: patient tolerated procedure well with no complications   Additional details:  Wound innoculated with 5 fluorouracil solution.  Specimen 1 - Surgical pathology Differential Diagnosis:scc in situ [phone] tx with bx today curet and cautery  Check Margins: No      I, Lavonna Monarch, MD, have reviewed all documentation for this visit.  The documentation on 08/26/20 for the exam, diagnosis, procedures, and orders are all accurate and complete.

## 2020-09-21 DIAGNOSIS — Z1231 Encounter for screening mammogram for malignant neoplasm of breast: Secondary | ICD-10-CM | POA: Diagnosis not present

## 2020-11-15 ENCOUNTER — Other Ambulatory Visit: Payer: Self-pay

## 2020-11-15 ENCOUNTER — Encounter: Payer: Self-pay | Admitting: Dermatology

## 2020-11-15 ENCOUNTER — Ambulatory Visit: Payer: PPO | Admitting: Dermatology

## 2020-11-15 DIAGNOSIS — Z85828 Personal history of other malignant neoplasm of skin: Secondary | ICD-10-CM | POA: Diagnosis not present

## 2020-11-15 DIAGNOSIS — L570 Actinic keratosis: Secondary | ICD-10-CM

## 2020-11-15 DIAGNOSIS — L821 Other seborrheic keratosis: Secondary | ICD-10-CM

## 2020-11-15 MED ORDER — IMIQUIMOD 5 % EX CREA: TOPICAL_CREAM | CUTANEOUS | 0 refills | Status: DC

## 2020-11-15 NOTE — Progress Notes (Signed)
   Follow-Up Visit   Subjective  Carolyn Massey is a 80 y.o. female who presents for the following: Follow-up (Left forearm-anterior & dorsum of nose- healing good).  Sore spot on tip of nose and right temple plus check old spots Location:  Duration:  Quality:  Associated Signs/Symptoms: Modifying Factors:  Severity:  Timing: Context:   Objective  Well appearing patient in no apparent distress; mood and affect are within normal limits. Left Upper Arm - Anterior, Mid Tip of Nose Thin but sensitive lesions on the right infra tip of the nose and right temple will be treated with Aldara.  Thicker lesion left upper arm was treated with 5-second LN2 freeze.  Left Malar Cheek History of multiple nonmelanoma skin cancers, no sign recurrence    A focused examination was performed including head, neck, arms. Relevant physical exam findings are noted in the Assessment and Plan.   Assessment & Plan    AK (actinic keratosis) (2) Left Upper Arm - Anterior; Mid Tip of Nose  Will use Aldara creamon nose and temple- no LN2 - follow up in 1 year-   imiquimod (ALDARA) 5 % cream - Left Upper Arm - Anterior Apply Monday, wed & Friday qhs x 6 weeks  Destruction of lesion - Left Upper Arm - Anterior Complexity: simple   Destruction method: cryotherapy   Informed consent: discussed and consent obtained   Lesion destroyed using liquid nitrogen: Yes   Cryotherapy cycles:  5 Outcome: patient tolerated procedure well with no complications    Seborrheic keratosis Right Submandibular Area  Personal history of skin cancer Left Malar Cheek  Annual skin examination.      I, Lavonna Monarch, MD, have reviewed all documentation for this visit.  The documentation on 11/15/20 for the exam, diagnosis, procedures, and orders are all accurate and complete.

## 2021-08-16 DIAGNOSIS — R946 Abnormal results of thyroid function studies: Secondary | ICD-10-CM | POA: Diagnosis not present

## 2021-08-16 DIAGNOSIS — E7849 Other hyperlipidemia: Secondary | ICD-10-CM | POA: Diagnosis not present

## 2021-08-16 DIAGNOSIS — Z0001 Encounter for general adult medical examination with abnormal findings: Secondary | ICD-10-CM | POA: Diagnosis not present

## 2021-08-16 DIAGNOSIS — M1711 Unilateral primary osteoarthritis, right knee: Secondary | ICD-10-CM | POA: Diagnosis not present

## 2021-08-16 DIAGNOSIS — C4491 Basal cell carcinoma of skin, unspecified: Secondary | ICD-10-CM | POA: Diagnosis not present

## 2021-08-16 DIAGNOSIS — I471 Supraventricular tachycardia: Secondary | ICD-10-CM | POA: Diagnosis not present

## 2021-08-16 DIAGNOSIS — Z681 Body mass index (BMI) 19 or less, adult: Secondary | ICD-10-CM | POA: Diagnosis not present

## 2021-08-16 DIAGNOSIS — L570 Actinic keratosis: Secondary | ICD-10-CM | POA: Diagnosis not present

## 2021-08-16 DIAGNOSIS — Z1331 Encounter for screening for depression: Secondary | ICD-10-CM | POA: Diagnosis not present

## 2021-08-16 DIAGNOSIS — M5030 Other cervical disc degeneration, unspecified cervical region: Secondary | ICD-10-CM | POA: Diagnosis not present

## 2021-08-16 DIAGNOSIS — E782 Mixed hyperlipidemia: Secondary | ICD-10-CM | POA: Diagnosis not present

## 2021-10-06 DIAGNOSIS — Z01419 Encounter for gynecological examination (general) (routine) without abnormal findings: Secondary | ICD-10-CM | POA: Diagnosis not present

## 2021-10-06 DIAGNOSIS — Z1231 Encounter for screening mammogram for malignant neoplasm of breast: Secondary | ICD-10-CM | POA: Diagnosis not present

## 2021-10-06 DIAGNOSIS — Z681 Body mass index (BMI) 19 or less, adult: Secondary | ICD-10-CM | POA: Diagnosis not present

## 2022-05-03 DIAGNOSIS — L989 Disorder of the skin and subcutaneous tissue, unspecified: Secondary | ICD-10-CM | POA: Diagnosis not present

## 2022-05-03 DIAGNOSIS — D485 Neoplasm of uncertain behavior of skin: Secondary | ICD-10-CM | POA: Diagnosis not present

## 2022-05-03 DIAGNOSIS — D2271 Melanocytic nevi of right lower limb, including hip: Secondary | ICD-10-CM | POA: Diagnosis not present

## 2022-05-03 DIAGNOSIS — L570 Actinic keratosis: Secondary | ICD-10-CM | POA: Diagnosis not present

## 2022-05-03 DIAGNOSIS — Z08 Encounter for follow-up examination after completed treatment for malignant neoplasm: Secondary | ICD-10-CM | POA: Diagnosis not present

## 2022-05-03 DIAGNOSIS — C44329 Squamous cell carcinoma of skin of other parts of face: Secondary | ICD-10-CM | POA: Diagnosis not present

## 2022-05-03 DIAGNOSIS — L814 Other melanin hyperpigmentation: Secondary | ICD-10-CM | POA: Diagnosis not present

## 2022-05-03 DIAGNOSIS — L821 Other seborrheic keratosis: Secondary | ICD-10-CM | POA: Diagnosis not present

## 2022-05-03 DIAGNOSIS — Z85828 Personal history of other malignant neoplasm of skin: Secondary | ICD-10-CM | POA: Diagnosis not present

## 2022-05-03 DIAGNOSIS — D225 Melanocytic nevi of trunk: Secondary | ICD-10-CM | POA: Diagnosis not present

## 2022-05-10 DIAGNOSIS — C44329 Squamous cell carcinoma of skin of other parts of face: Secondary | ICD-10-CM | POA: Diagnosis not present

## 2022-05-30 DIAGNOSIS — L089 Local infection of the skin and subcutaneous tissue, unspecified: Secondary | ICD-10-CM | POA: Diagnosis not present

## 2022-06-12 DIAGNOSIS — I872 Venous insufficiency (chronic) (peripheral): Secondary | ICD-10-CM | POA: Diagnosis not present

## 2022-06-12 DIAGNOSIS — L905 Scar conditions and fibrosis of skin: Secondary | ICD-10-CM | POA: Diagnosis not present

## 2022-06-12 DIAGNOSIS — C4372 Malignant melanoma of left lower limb, including hip: Secondary | ICD-10-CM | POA: Diagnosis not present

## 2022-06-12 DIAGNOSIS — L989 Disorder of the skin and subcutaneous tissue, unspecified: Secondary | ICD-10-CM | POA: Diagnosis not present

## 2022-06-21 DIAGNOSIS — L905 Scar conditions and fibrosis of skin: Secondary | ICD-10-CM | POA: Diagnosis not present

## 2022-07-05 DIAGNOSIS — L905 Scar conditions and fibrosis of skin: Secondary | ICD-10-CM | POA: Diagnosis not present

## 2022-07-05 DIAGNOSIS — Z48817 Encounter for surgical aftercare following surgery on the skin and subcutaneous tissue: Secondary | ICD-10-CM | POA: Diagnosis not present

## 2022-07-19 DIAGNOSIS — D0439 Carcinoma in situ of skin of other parts of face: Secondary | ICD-10-CM | POA: Diagnosis not present

## 2022-07-19 DIAGNOSIS — Z48817 Encounter for surgical aftercare following surgery on the skin and subcutaneous tissue: Secondary | ICD-10-CM | POA: Diagnosis not present

## 2022-07-19 DIAGNOSIS — D485 Neoplasm of uncertain behavior of skin: Secondary | ICD-10-CM | POA: Diagnosis not present

## 2022-07-19 DIAGNOSIS — C44729 Squamous cell carcinoma of skin of left lower limb, including hip: Secondary | ICD-10-CM | POA: Diagnosis not present

## 2022-07-31 DIAGNOSIS — Z4801 Encounter for change or removal of surgical wound dressing: Secondary | ICD-10-CM | POA: Diagnosis not present

## 2022-07-31 DIAGNOSIS — C44729 Squamous cell carcinoma of skin of left lower limb, including hip: Secondary | ICD-10-CM | POA: Diagnosis not present

## 2022-07-31 DIAGNOSIS — L570 Actinic keratosis: Secondary | ICD-10-CM | POA: Diagnosis not present

## 2022-08-30 DIAGNOSIS — C44729 Squamous cell carcinoma of skin of left lower limb, including hip: Secondary | ICD-10-CM | POA: Diagnosis not present

## 2022-08-30 DIAGNOSIS — D485 Neoplasm of uncertain behavior of skin: Secondary | ICD-10-CM | POA: Diagnosis not present

## 2022-09-08 DIAGNOSIS — C44729 Squamous cell carcinoma of skin of left lower limb, including hip: Secondary | ICD-10-CM | POA: Diagnosis not present

## 2022-09-12 DIAGNOSIS — C4491 Basal cell carcinoma of skin, unspecified: Secondary | ICD-10-CM | POA: Diagnosis not present

## 2022-09-12 DIAGNOSIS — Z681 Body mass index (BMI) 19 or less, adult: Secondary | ICD-10-CM | POA: Diagnosis not present

## 2022-09-12 DIAGNOSIS — E782 Mixed hyperlipidemia: Secondary | ICD-10-CM | POA: Diagnosis not present

## 2022-09-12 DIAGNOSIS — Z0001 Encounter for general adult medical examination with abnormal findings: Secondary | ICD-10-CM | POA: Diagnosis not present

## 2022-09-12 DIAGNOSIS — L570 Actinic keratosis: Secondary | ICD-10-CM | POA: Diagnosis not present

## 2022-09-12 DIAGNOSIS — E7849 Other hyperlipidemia: Secondary | ICD-10-CM | POA: Diagnosis not present

## 2022-09-12 DIAGNOSIS — Z1331 Encounter for screening for depression: Secondary | ICD-10-CM | POA: Diagnosis not present

## 2022-09-12 DIAGNOSIS — R946 Abnormal results of thyroid function studies: Secondary | ICD-10-CM | POA: Diagnosis not present

## 2022-09-14 DIAGNOSIS — C44729 Squamous cell carcinoma of skin of left lower limb, including hip: Secondary | ICD-10-CM | POA: Diagnosis not present

## 2022-10-11 DIAGNOSIS — Z1231 Encounter for screening mammogram for malignant neoplasm of breast: Secondary | ICD-10-CM | POA: Diagnosis not present

## 2022-10-28 DIAGNOSIS — M62830 Muscle spasm of back: Secondary | ICD-10-CM | POA: Diagnosis not present

## 2022-10-28 DIAGNOSIS — Z681 Body mass index (BMI) 19 or less, adult: Secondary | ICD-10-CM | POA: Diagnosis not present

## 2022-10-28 DIAGNOSIS — R030 Elevated blood-pressure reading, without diagnosis of hypertension: Secondary | ICD-10-CM | POA: Diagnosis not present

## 2022-11-01 DIAGNOSIS — Z7189 Other specified counseling: Secondary | ICD-10-CM | POA: Diagnosis not present

## 2022-11-01 DIAGNOSIS — Z85828 Personal history of other malignant neoplasm of skin: Secondary | ICD-10-CM | POA: Diagnosis not present

## 2022-11-01 DIAGNOSIS — C44319 Basal cell carcinoma of skin of other parts of face: Secondary | ICD-10-CM | POA: Diagnosis not present

## 2022-11-01 DIAGNOSIS — C44629 Squamous cell carcinoma of skin of left upper limb, including shoulder: Secondary | ICD-10-CM | POA: Diagnosis not present

## 2022-11-01 DIAGNOSIS — L570 Actinic keratosis: Secondary | ICD-10-CM | POA: Diagnosis not present

## 2022-11-01 DIAGNOSIS — D485 Neoplasm of uncertain behavior of skin: Secondary | ICD-10-CM | POA: Diagnosis not present

## 2022-11-01 DIAGNOSIS — D225 Melanocytic nevi of trunk: Secondary | ICD-10-CM | POA: Diagnosis not present

## 2022-11-01 DIAGNOSIS — Z08 Encounter for follow-up examination after completed treatment for malignant neoplasm: Secondary | ICD-10-CM | POA: Diagnosis not present

## 2022-11-01 DIAGNOSIS — L989 Disorder of the skin and subcutaneous tissue, unspecified: Secondary | ICD-10-CM | POA: Diagnosis not present

## 2022-11-01 DIAGNOSIS — L814 Other melanin hyperpigmentation: Secondary | ICD-10-CM | POA: Diagnosis not present

## 2022-11-01 DIAGNOSIS — Z85820 Personal history of malignant melanoma of skin: Secondary | ICD-10-CM | POA: Diagnosis not present

## 2022-11-01 DIAGNOSIS — L821 Other seborrheic keratosis: Secondary | ICD-10-CM | POA: Diagnosis not present

## 2022-11-07 DIAGNOSIS — D0462 Carcinoma in situ of skin of left upper limb, including shoulder: Secondary | ICD-10-CM | POA: Diagnosis not present

## 2022-11-07 DIAGNOSIS — L989 Disorder of the skin and subcutaneous tissue, unspecified: Secondary | ICD-10-CM | POA: Diagnosis not present

## 2022-11-21 DIAGNOSIS — C44319 Basal cell carcinoma of skin of other parts of face: Secondary | ICD-10-CM | POA: Diagnosis not present

## 2022-11-21 DIAGNOSIS — Z4802 Encounter for removal of sutures: Secondary | ICD-10-CM | POA: Diagnosis not present

## 2022-12-05 DIAGNOSIS — C44319 Basal cell carcinoma of skin of other parts of face: Secondary | ICD-10-CM | POA: Diagnosis not present

## 2023-01-31 DIAGNOSIS — Z85828 Personal history of other malignant neoplasm of skin: Secondary | ICD-10-CM | POA: Diagnosis not present

## 2023-01-31 DIAGNOSIS — D0461 Carcinoma in situ of skin of right upper limb, including shoulder: Secondary | ICD-10-CM | POA: Diagnosis not present

## 2023-01-31 DIAGNOSIS — L708 Other acne: Secondary | ICD-10-CM | POA: Diagnosis not present

## 2023-01-31 DIAGNOSIS — L570 Actinic keratosis: Secondary | ICD-10-CM | POA: Diagnosis not present

## 2023-01-31 DIAGNOSIS — Z08 Encounter for follow-up examination after completed treatment for malignant neoplasm: Secondary | ICD-10-CM | POA: Diagnosis not present

## 2023-01-31 DIAGNOSIS — Z7189 Other specified counseling: Secondary | ICD-10-CM | POA: Diagnosis not present

## 2023-01-31 DIAGNOSIS — Z86006 Personal history of melanoma in-situ: Secondary | ICD-10-CM | POA: Diagnosis not present

## 2023-01-31 DIAGNOSIS — D225 Melanocytic nevi of trunk: Secondary | ICD-10-CM | POA: Diagnosis not present

## 2023-01-31 DIAGNOSIS — L814 Other melanin hyperpigmentation: Secondary | ICD-10-CM | POA: Diagnosis not present

## 2023-01-31 DIAGNOSIS — L821 Other seborrheic keratosis: Secondary | ICD-10-CM | POA: Diagnosis not present

## 2023-01-31 DIAGNOSIS — D485 Neoplasm of uncertain behavior of skin: Secondary | ICD-10-CM | POA: Diagnosis not present

## 2023-02-19 DIAGNOSIS — D0461 Carcinoma in situ of skin of right upper limb, including shoulder: Secondary | ICD-10-CM | POA: Diagnosis not present

## 2023-04-18 DIAGNOSIS — C44722 Squamous cell carcinoma of skin of right lower limb, including hip: Secondary | ICD-10-CM | POA: Diagnosis not present

## 2023-04-18 DIAGNOSIS — L570 Actinic keratosis: Secondary | ICD-10-CM | POA: Diagnosis not present

## 2023-04-18 DIAGNOSIS — D485 Neoplasm of uncertain behavior of skin: Secondary | ICD-10-CM | POA: Diagnosis not present

## 2023-04-25 DIAGNOSIS — C44722 Squamous cell carcinoma of skin of right lower limb, including hip: Secondary | ICD-10-CM | POA: Diagnosis not present

## 2023-05-16 DIAGNOSIS — C44722 Squamous cell carcinoma of skin of right lower limb, including hip: Secondary | ICD-10-CM | POA: Diagnosis not present

## 2023-05-29 ENCOUNTER — Other Ambulatory Visit (HOSPITAL_COMMUNITY): Payer: Self-pay | Admitting: Family Medicine

## 2023-05-29 DIAGNOSIS — D171 Benign lipomatous neoplasm of skin and subcutaneous tissue of trunk: Secondary | ICD-10-CM | POA: Diagnosis not present

## 2023-05-29 DIAGNOSIS — R591 Generalized enlarged lymph nodes: Secondary | ICD-10-CM

## 2023-05-29 DIAGNOSIS — R590 Localized enlarged lymph nodes: Secondary | ICD-10-CM | POA: Diagnosis not present

## 2023-05-29 DIAGNOSIS — Z681 Body mass index (BMI) 19 or less, adult: Secondary | ICD-10-CM | POA: Diagnosis not present

## 2023-05-30 ENCOUNTER — Other Ambulatory Visit (HOSPITAL_COMMUNITY): Payer: Self-pay | Admitting: Family Medicine

## 2023-05-30 DIAGNOSIS — R591 Generalized enlarged lymph nodes: Secondary | ICD-10-CM

## 2023-06-06 ENCOUNTER — Ambulatory Visit (HOSPITAL_COMMUNITY)
Admission: RE | Admit: 2023-06-06 | Discharge: 2023-06-06 | Disposition: A | Discharge status: Home / Self Care | Source: Ambulatory Visit | Attending: Family Medicine | Admitting: Family Medicine

## 2023-06-06 DIAGNOSIS — R591 Generalized enlarged lymph nodes: Secondary | ICD-10-CM

## 2023-06-06 DIAGNOSIS — R590 Localized enlarged lymph nodes: Secondary | ICD-10-CM | POA: Diagnosis not present

## 2023-06-22 ENCOUNTER — Encounter: Payer: Self-pay | Admitting: Medical Oncology

## 2023-06-25 NOTE — Progress Notes (Signed)
 [facility] [phone]   [phone]  INITIAL CONSULTATION:  Patient Care Team: Minus Amel, MD as PCP - General (Family Medicine) Devon Fogo, MD (Inactive) as Consulting Physician (Dermatology) Annella Barrows, RN as Oncology Nurse Navigator (Medical Oncology)  CHIEF COMPLAINTS/PURPOSE OF CONSULTATION:  "lymphadenopathy"  HISTORY OF PRESENTING ILLNESS:  Carolyn Massey 83 y.o. female with medical history significant for basal cell carcinoma on left forearm, carcinoma in situ on skin of face, hypertension, hyperlipidemia, osteoarthritis of right knee.   On review of the previous records patient is being referred by PCP Dr. Glady Laming for evaluation of lymphadenopathy found on annual physical exam on 05/29/23.  CBC from that visit was WNL. Ultrasound evaluation of bilateral inguinal region performed on 06/06/23 demonstrates enlarged lymph nodes with the largest on the right measuring 3.0 x 1.4 x 1.4 cm and largest on the left measuring 2.4 x 1.2 x 1.3 cm. Patient also evaluated for breast mass and a right mammogram and US  axilla have been ordered. MD documented mass on right chest wall consistent with lipoma.  On exam today patient is accompanied by her daughter who provides additional history. She first noticed enlarged lymph nodes in her groin around mid-February 2025. The nodes are non-painful and have not significantly changed in size, although the one on the left side may have decreased slightly. She also feels a mass in her chest area that is being worked up by PCP she reports. She has a significant history of skin cancers, including basal cell carcinoma on her left forearm, squamous cell carcinoma on her left cheek and near her right eye, and melanoma on her left leg, which was excised with clear margins in 2024. She has also had squamous cell carcinomas removed from her legs and has undergone Mohs surgery on her nose and  lip approximately 15-20 years ago. Some spots have been treated with cryotherapy and injections. She is treated by Dr. Gaylyn Keas at [facility]. She denies ant breast pain, tenderness, fevers, chills, recent illness, night sweats, weight loss, or abdominal pain.   Family history of leukemia in paternal grandmother and brothers diagnosed with lymphoma. Patient is a never smoker and does not drink alcohol.  Last mammogram 10/06/21 and was normal. Daughter states patient has never been recommended for colonoscopy therefore has not had one. Socially, she lives alone in [place], about 30 minutes from her daughter, who visits at least once a week. 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 and denies exposure to chemicals. She is a non-smoker and does not consume alcohol.   MEDICAL HISTORY:  Past Medical History:  Diagnosis Date   Basal cell carcinoma 02/15/1994   right shoulder(CX35FU), post neck (CX35FU)   Basal cell carcinoma 01/17/2000   upper Left lip (CX35FU)   Basal cell carcinoma 02/12/2001   superificial-mid chest (CX35FU), behind right shoulder (CX35FU)   Basal cell carcinoma 06/21/2001   ulcerated- Left bulb of nose (CX35FU)   Basal cell carcinoma 10/28/2002   sclerosis- over Right brow-(MOHS), sclerosis- Left tip nose (MOHS)   Basal cell carcinoma 06/30/2003   superificial-RIght inner mid calf (CX35FU)   Basal cell carcinoma 08/15/2005   superificial-Right chest (CX35FU)   Basal cell carcinoma 09/22/2009   superificial-mid spinal (CX35FU), Right nose-(CX35FU)   Basal cell carcinoma 06/21/2011   superificial- Right upper chest (SRT)   Basal cell carcinoma 02/05/2015   above Left upper lip inferior (MOHS), alove Left upper lip  superior (MOHS)   Basal cell carcinoma 06/06/2016   sup&nod-Left forehead-(CX35FU), nod-RIght mid back-(CX35FU)   Basal cell carcinoma 04/23/2019   superifcial-RIght scapula, sclerosis-Right chest superior   Nodular basal cell  carcinoma (BCC) 06/15/2020   Left Forearm anterior   SCCA (squamous cell carcinoma) of skin 06/15/2020   Dorsum of Nose (in situ)   Squamous cell carcinoma of skin 09/22/2009   in situ-Right collarbone(CX35FU),  insitu-mid chest (CX35FU), insitu-Right tip of nose (CX35FU)   Squamous cell carcinoma of skin 10/09/2017   in situ-Right post shoulder-(CX35FU), in situ-Left cheek (CX35FU)   Squamous cell carcinoma of skin 04/23/2019   insitu-Left upper arm, in situ-  below Right  knee outer    SURGICAL HISTORY: No past surgical history on file.  SOCIAL HISTORY: Social History   Socioeconomic History   Marital status: Married    Spouse name: Not on file   Number of children: Not on file   Years of education: Not on file   Highest education level: Not on file  Occupational History   Not on file  Tobacco Use   Smoking status: Never   Smokeless tobacco: Never  Vaping Use   Vaping status: Never Used  Substance and Sexual Activity   Alcohol use: Never   Drug use: Never   Sexual activity: Not on file  Other Topics Concern   Not on file  Social History Narrative   Not on file   Social Drivers of Health   Financial Resource Strain: Not on file  Food Insecurity: Not on file  Transportation Needs: Not on file  Physical Activity: Not on file  Stress: Not on file  Social Connections: Not on file  Intimate Partner Violence: Not on file    FAMILY HISTORY: No family history on file.  ALLERGIES:  has no known allergies.  MEDICATIONS:  Current Outpatient Medications  Medication Sig Dispense Refill   imiquimod (ALDARA) 5 % cream Apply Monday, wed & Friday qhs x 6 weeks 6 each 0   No current facility-administered medications for this visit.    REVIEW OF SYSTEMS:   All other systems are reviewed and are negative for acute change except as noted in the HPI.  PHYSICAL EXAMINATION: ECOG PERFORMANCE STATUS: 1 - Symptomatic but completely ambulatory  Vitals:   06/27/23 1232   BP: (!) 156/68  Pulse: 68  Resp: 13  Temp: 98 F (36.7 C)  SpO2: 96%   Filed Weights   06/27/23 1232  Weight: 118 lb 14.4 oz (53.9 kg)    Physical Exam Vitals reviewed.  Constitutional:      Appearance: She is not ill-appearing or toxic-appearing.  HENT:     Head: Normocephalic.     Right Ear: External ear normal.     Left Ear: External ear normal.     Mouth/Throat:     Mouth: Mucous membranes are moist.     Pharynx: Oropharynx is clear.  Eyes:     General: No scleral icterus. Cardiovascular:     Rate and Rhythm: Normal rate and regular rhythm.     Pulses: Normal pulses.     Heart sounds: Normal heart sounds.  Pulmonary:     Effort: Pulmonary effort is normal.     Breath sounds: Normal breath sounds.  Chest:     Comments: No evidence of any palpable masses in bilateral breasts. No evidence of axillary adenopathy.  Non tender mass on right lateral chest wall.  Abdominal:     General: There is no distension.  Palpations: Abdomen is soft.  Musculoskeletal:        General: Normal range of motion.     Cervical back: Normal range of motion.  Lymphadenopathy:     Cervical: No cervical adenopathy.     Upper Body:     Right upper body: No supraclavicular or axillary adenopathy.     Left upper body: No supraclavicular or axillary adenopathy.     Lower Body: Right inguinal adenopathy present. Left inguinal adenopathy present.  Skin:    General: Skin is warm and dry.  Neurological:     Mental Status: She is alert.       LABORATORY DATA:  I have reviewed the data as listed    Latest Ref Rng & Units 06/27/2023    1:56 PM 10/02/2018    1:11 AM  CBC  WBC 4.0 - 10.5 K/uL 4.7  6.5   Hemoglobin 12.0 - 15.0 g/dL 16.1  09.6   Hematocrit 36.0 - 46.0 % 34.4  38.5   Platelets 150 - 400 K/uL 163  153        Latest Ref Rng & Units 06/27/2023    1:56 PM 10/02/2018    1:11 AM  CMP  Glucose 70 - 99 mg/dL 90  045   BUN 8 - 23 mg/dL 13  14   Creatinine 4.09 - 1.00 mg/dL  8.11  9.14   Sodium 782 - 145 mmol/L 138  138   Potassium 3.5 - 5.1 mmol/L 4.5  3.9   Chloride 98 - 111 mmol/L 105  108   CO2 22 - 32 mmol/L 27  22   Calcium 8.9 - 10.3 mg/dL 9.4  9.6   Total Protein 6.5 - 8.1 g/dL 6.9    Total Bilirubin 0.0 - 1.2 mg/dL 0.5    Alkaline Phos 38 - 126 U/L 66    AST 15 - 41 U/L 30    ALT 0 - 44 U/L 22       RADIOGRAPHIC STUDIES: I have personally reviewed the radiological images as listed and agreed with the findings in the report. US  RT LOWER EXTREM LTD SOFT TISSUE NON VASCULAR Result Date: 06/20/2023 CLINICAL DATA:  Bilateral inguinal lymphadenopathy EXAM: ULTRASOUND BILATERAL LOWER EXTREMITY LIMITED TECHNIQUE: Ultrasound examination of the lower extremity soft tissues was performed in the area of clinical concern. COMPARISON:  None available FINDINGS: Ultrasound evaluation of bilateral inguinal region demonstrates enlarged lymph nodes with the largest on the right measuring 3.0 x 1.4 x 1.4 cm and largest on the left measuring 2.4 x 1.2 x 1.3 cm. IMPRESSION: Ultrasound evaluation of the inguinal regions bilaterally demonstrate abnormally enlarged lymph nodes suspicious for lymphoma/leukemia versus metastatic disease. Electronically Signed   By: Elester Grim M.D.   On: 06/20/2023 09:42   US  LT LOWER EXTREM LTD SOFT TISSUE NON VASCULAR Result Date: 06/20/2023 CLINICAL DATA:  Bilateral inguinal lymphadenopathy EXAM: ULTRASOUND BILATERAL LOWER EXTREMITY LIMITED TECHNIQUE: Ultrasound examination of the lower extremity soft tissues was performed in the area of clinical concern. COMPARISON:  None available FINDINGS: Ultrasound evaluation of bilateral inguinal region demonstrates enlarged lymph nodes with the largest on the right measuring 3.0 x 1.4 x 1.4 cm and largest on the left measuring 2.4 x 1.2 x 1.3 cm. IMPRESSION: Ultrasound evaluation of the inguinal regions bilaterally demonstrate abnormally enlarged lymph nodes suspicious for lymphoma/leukemia versus metastatic  disease. Electronically Signed   By: Elester Grim M.D.   On: 06/20/2023 09:42    ASSESSMENT & PLAN Carolyn Massey is  a 83 y.o. female presenting to the [facility] for consultation regarding lymphadenopathy. We have reviewed etiologies including infectious process, inflammatory process, lymphoproliferative disorder vs metastatic disease. Patient will proceed with laboratory workup today.   #Lymphadenopathy - Palpable inguinal lymphadenopathy on exam. Patient has history of skin cancer including melanoma. - Labs collected today include CBC, CMP, ESR, CRP, LDH, flow cytometry, Hep B & C serologies, HIV serology. - Will order CT CAP to evaluate for lymphadenopathy. Patient has no recent CT imaging in our system.  #Chest wall mass - Palpated on right lateral chest wall. No palpable breast mass. PCP ordred right mammogram and US  of axilla which are scheduled for tomorrow. I will follow up on those results.  #Age related screenings -Patient does not wish to have a colonoscopy at this time as she has not performed the recommended screenings up to this point.   -Patient will RTC when work up is complete.  Patient expressed understanding of the recommended workup and is agreeable to move forward.   All questions were answered. The patient knows to call the clinic with any problems, questions or concerns.  Shared visit with Dr. Vergia Glasgow.  Orders Placed This Encounter  Procedures   CT CHEST ABDOMEN PELVIS W CONTRAST    Rapid diagnostic clinic pt    Standing Status:   Future    Expected Date:   06/28/2023    Expiration Date:   06/26/2024    If indicated for the ordered procedure, I authorize the administration of contrast media per Radiology protocol:   Yes    Does the patient have a contrast media/X-ray dye allergy?:   No    Preferred imaging location?:   [facility]    If indicated for the ordered procedure, I authorize the administration of oral contrast media per  Radiology protocol:   Yes   CBC with Differential (Cancer Center Only)   CMP (Cancer Center only)   C-reactive protein   Flow Cytometry, Peripheral Blood (Oncology)   Hepatitis B core antibody, total   Hepatitis B surface antibody,qualitative   Hepatitis B surface antigen   Hepatitis C antibody   HIV Antibody (routine testing w rflx)   Lactate dehydrogenase   Sedimentation rate      I have spent a total of 60 minutes minutes of face-to-face and non-face-to-face time, preparing to see the patient, obtaining and/or reviewing separately obtained history, performing a medically appropriate examination, counseling and educating the patient, ordering medications/tests/procedures, referring and communicating with other health care professionals, documenting clinical information in the electronic health record, independently interpreting results and communicating results to the patient, and care coordination.   Alexius Hangartner Walisiewicz PA-C Department of Hematology/Oncology [facility] at [facility] Phone: [phone]  I have read the above note and personally examined the patient. I agree with the assessment and plan as noted above.  Briefly Carolyn Massey is an 83 year old female who presents for evaluation of inguinal lymphadenopathy.  The patient had an ultrasound performed on 06/06/2023 which showed enlarged lymph nodes with the largest on the right measuring 3.0 x 1.4 x 1.4 cm and largest on the left measuring 2.4 x 1.2 x 1.3 cm.  Due to suspicion for lymphoma the patient was referred to hematology for further evaluation and management.  The patient first noticed these lesions in February 2025 and notes that they are nontender and nonpainful.  She has had no correlating infectious symptoms such as UTI or rash/swelling of the lower  extremity.  At this time findings are suspicious for malignant process.  The patient's labs also showed no other concerning hematological  abnormalities with normal white blood cell count hemoglobin/platelets.  Recommend a full CT chest abdomen pelvis to assess for best site for excisional biopsy.  Also today we will order ESR, CRP, and flow cytometry.  The patient voiced understanding of our findings and plan moving forward.   Rogerio Clay, MD Department of Hematology/Oncology [facility] at [facility] Phone: [phone] Pager: [phone] Email: [email or web address]

## 2023-06-27 ENCOUNTER — Encounter: Payer: Self-pay | Admitting: Medical Oncology

## 2023-06-27 ENCOUNTER — Inpatient Hospital Stay

## 2023-06-27 ENCOUNTER — Inpatient Hospital Stay: Attending: Physician Assistant | Admitting: Physician Assistant

## 2023-06-27 VITALS — BP 156/68 | HR 68 | Temp 98.0°F | Resp 13 | Wt 118.9 lb

## 2023-06-27 DIAGNOSIS — R591 Generalized enlarged lymph nodes: Secondary | ICD-10-CM | POA: Diagnosis not present

## 2023-06-27 DIAGNOSIS — K429 Umbilical hernia without obstruction or gangrene: Secondary | ICD-10-CM | POA: Insufficient documentation

## 2023-06-27 DIAGNOSIS — Z85820 Personal history of malignant melanoma of skin: Secondary | ICD-10-CM | POA: Diagnosis not present

## 2023-06-27 DIAGNOSIS — I2510 Atherosclerotic heart disease of native coronary artery without angina pectoris: Secondary | ICD-10-CM | POA: Diagnosis not present

## 2023-06-27 DIAGNOSIS — N200 Calculus of kidney: Secondary | ICD-10-CM | POA: Insufficient documentation

## 2023-06-27 DIAGNOSIS — N6310 Unspecified lump in the right breast, unspecified quadrant: Secondary | ICD-10-CM | POA: Insufficient documentation

## 2023-06-27 DIAGNOSIS — Z85828 Personal history of other malignant neoplasm of skin: Secondary | ICD-10-CM | POA: Diagnosis not present

## 2023-06-27 DIAGNOSIS — Z807 Family history of other malignant neoplasms of lymphoid, hematopoietic and related tissues: Secondary | ICD-10-CM | POA: Diagnosis not present

## 2023-06-27 DIAGNOSIS — E785 Hyperlipidemia, unspecified: Secondary | ICD-10-CM | POA: Diagnosis not present

## 2023-06-27 DIAGNOSIS — C8290 Follicular lymphoma, unspecified, unspecified site: Secondary | ICD-10-CM | POA: Insufficient documentation

## 2023-06-27 DIAGNOSIS — I700 Atherosclerosis of aorta: Secondary | ICD-10-CM | POA: Diagnosis not present

## 2023-06-27 DIAGNOSIS — R590 Localized enlarged lymph nodes: Secondary | ICD-10-CM | POA: Diagnosis present

## 2023-06-27 DIAGNOSIS — I10 Essential (primary) hypertension: Secondary | ICD-10-CM | POA: Insufficient documentation

## 2023-06-27 DIAGNOSIS — Z806 Family history of leukemia: Secondary | ICD-10-CM | POA: Insufficient documentation

## 2023-06-27 DIAGNOSIS — M1711 Unilateral primary osteoarthritis, right knee: Secondary | ICD-10-CM | POA: Diagnosis not present

## 2023-06-27 LAB — CMP (CANCER CENTER ONLY)
ALT: 22 U/L (ref 0–44)
AST: 30 U/L (ref 15–41)
Albumin: 4.4 g/dL (ref 3.5–5.0)
Alkaline Phosphatase: 66 U/L (ref 38–126)
Anion gap: 6 (ref 5–15)
BUN: 13 mg/dL (ref 8–23)
CO2: 27 mmol/L (ref 22–32)
Calcium: 9.4 mg/dL (ref 8.9–10.3)
Chloride: 105 mmol/L (ref 98–111)
Creatinine: 0.99 mg/dL (ref 0.44–1.00)
GFR, Estimated: 57 mL/min — ABNORMAL LOW (ref >60)
Glucose, Bld: 90 mg/dL (ref 70–99)
Potassium: 4.5 mmol/L (ref 3.5–5.1)
Sodium: 138 mmol/L (ref 135–145)
Total Bilirubin: 0.5 mg/dL (ref 0.0–1.2)
Total Protein: 6.9 g/dL (ref 6.5–8.1)

## 2023-06-27 LAB — CBC WITH DIFFERENTIAL (CANCER CENTER ONLY)
Abs Immature Granulocytes: 0.01 10*3/uL (ref 0.00–0.07)
Basophils Absolute: 0 10*3/uL (ref 0.0–0.1)
Basophils Relative: 1 %
Eosinophils Absolute: 0.1 10*3/uL (ref 0.0–0.5)
Eosinophils Relative: 2 %
HCT: 34.4 % — ABNORMAL LOW (ref 36.0–46.0)
Hemoglobin: 11.4 g/dL — ABNORMAL LOW (ref 12.0–15.0)
Immature Granulocytes: 0 %
Lymphocytes Relative: 31 %
Lymphs Abs: 1.4 10*3/uL (ref 0.7–4.0)
MCH: 28.3 pg (ref 26.0–34.0)
MCHC: 33.1 g/dL (ref 30.0–36.0)
MCV: 85.4 fL (ref 80.0–100.0)
Monocytes Absolute: 0.5 10*3/uL (ref 0.1–1.0)
Monocytes Relative: 10 %
Neutro Abs: 2.7 10*3/uL (ref 1.7–7.7)
Neutrophils Relative %: 56 %
Platelet Count: 163 10*3/uL (ref 150–400)
RBC: 4.03 MIL/uL (ref 3.87–5.11)
RDW: 13 % (ref 11.5–15.5)
WBC Count: 4.7 10*3/uL (ref 4.0–10.5)
nRBC: 0 % (ref 0.0–0.2)

## 2023-06-27 LAB — LACTATE DEHYDROGENASE: LDH: 201 U/L — ABNORMAL HIGH (ref 98–192)

## 2023-06-27 LAB — HEPATITIS C ANTIBODY: HCV Ab: NONREACTIVE

## 2023-06-27 LAB — SEDIMENTATION RATE: Sed Rate: 5 mm/h (ref 0–22)

## 2023-06-27 LAB — C-REACTIVE PROTEIN: CRP: 0.5 mg/dL (ref <1.0)

## 2023-06-27 LAB — HEPATITIS B SURFACE ANTIBODY,QUALITATIVE: Hep B S Ab: NONREACTIVE

## 2023-06-27 LAB — HIV ANTIBODY (ROUTINE TESTING W REFLEX): HIV Screen 4th Generation wRfx: NONREACTIVE

## 2023-06-27 LAB — HEPATITIS B SURFACE ANTIGEN: Hepatitis B Surface Ag: NONREACTIVE

## 2023-06-27 NOTE — Patient Instructions (Signed)
 Diagnostic Clinic Office Visit Discharge Information and Instructions  Thank you for choosing [facility] for your healthcare needs.  Below is a summary of today's discussion, along with our contact information and an outline of what to expect next.  Reason for Visit:  lymphadenopathy  Proposed Diagnostic Care Plan: Labs collected today CT scan of your chest abdomen and pelvis ordered.  Please call the Central Scheduling Department to schedule your scan once it is approved. The phone number is [phone]. The scan can happen at any of the [facility] facilities. I will call you once I have the scan results.     What to Expect: - Generally, when lab tests are ordered the results can take up to 1 week for results to be available.  At that point, we will contact you to discuss your results with you.  Unless there is a critical result, we will typically wait for all of your lab results to be available before contacting you. - If a biopsy is part of your Care Plan, those results can take on average 7-10 days to result.  Once results are available, we will contact you to discuss your pathology results and any next steps. - If you have additional imaging ordered, such as a CT Scan, MRI, Ultrasound, Bone Scan, or PET scan, your imaging will need to be authorized then scheduled with the earliest available appointment.  You may be asked to travel to another hospital within [facility] who has a sooner availability, please consider doing so if asked. - If you use MyChart, your results will be available to you in the MyChart portal.  Your provider will be in touch with you as soon as all of your results are available to be discussed.  Your Diagnostic Clinic Provider:  Namon Cirri PA-C and Dr. Leonides Schanz Your Diagnostic Navigator:  Chauncy Lean RN, office number [phone]  If you or your caregiver have number blocking on your cell phones, please ensure the cancer center's numbers are not  blocked.  If you are not a registered MyChart user, please consider enrolling in MyChart to receive your test results and visit notes.  You can also access your discharge instructions electronically.  MyChart also gives you an electronic means to communicate with your Care Team instead of needing to call in to the cancer center.  We appreciate you trusting [place] with your healthcare and look forward to partnering with you as we work to uncover what your potential diagnosis may be.  Please do not hesitate to reach out at any point with questions or concerns.

## 2023-06-27 NOTE — Progress Notes (Signed)
 [facility]  Patient presented to clinic, with her daughter, for her scheduled appointment with PA-C Jae Dire. I introduced myself and provided them both with my direct contact information. Patient and daughter were both encouraged to call me with any questions/concerns they may have.  Gregary Cromer, RN, BSN, [facility] Oncology Nurse Navigator, [facility] 06/27/2023 2:39 PM

## 2023-06-28 ENCOUNTER — Other Ambulatory Visit (HOSPITAL_COMMUNITY): Payer: Self-pay | Admitting: Family Medicine

## 2023-06-28 ENCOUNTER — Ambulatory Visit (HOSPITAL_COMMUNITY)
Admission: RE | Admit: 2023-06-28 | Discharge: 2023-06-28 | Disposition: A | Discharge status: Home / Self Care | Source: Ambulatory Visit | Attending: Family Medicine | Admitting: Family Medicine

## 2023-06-28 DIAGNOSIS — R591 Generalized enlarged lymph nodes: Secondary | ICD-10-CM

## 2023-06-28 DIAGNOSIS — R92323 Mammographic fibroglandular density, bilateral breasts: Secondary | ICD-10-CM | POA: Diagnosis not present

## 2023-06-28 DIAGNOSIS — N6459 Other signs and symptoms in breast: Secondary | ICD-10-CM | POA: Diagnosis not present

## 2023-06-28 DIAGNOSIS — R599 Enlarged lymph nodes, unspecified: Secondary | ICD-10-CM | POA: Diagnosis not present

## 2023-06-28 LAB — HEPATITIS B CORE ANTIBODY, TOTAL: HEP B CORE AB: NEGATIVE

## 2023-06-28 LAB — SURGICAL PATHOLOGY

## 2023-06-28 LAB — FLOW CYTOMETRY

## 2023-06-29 ENCOUNTER — Ambulatory Visit (HOSPITAL_COMMUNITY)
Admission: RE | Admit: 2023-06-29 | Discharge: 2023-06-29 | Disposition: A | Discharge status: Home / Self Care | Source: Ambulatory Visit | Attending: Physician Assistant | Admitting: Physician Assistant

## 2023-06-29 ENCOUNTER — Encounter: Payer: Self-pay | Admitting: Physician Assistant

## 2023-06-29 ENCOUNTER — Telehealth: Payer: Self-pay | Admitting: Physician Assistant

## 2023-06-29 ENCOUNTER — Other Ambulatory Visit: Payer: Self-pay | Admitting: Physician Assistant

## 2023-06-29 DIAGNOSIS — R590 Localized enlarged lymph nodes: Secondary | ICD-10-CM | POA: Diagnosis not present

## 2023-06-29 DIAGNOSIS — R591 Generalized enlarged lymph nodes: Secondary | ICD-10-CM | POA: Insufficient documentation

## 2023-06-29 DIAGNOSIS — K7689 Other specified diseases of liver: Secondary | ICD-10-CM | POA: Diagnosis not present

## 2023-06-29 DIAGNOSIS — N200 Calculus of kidney: Secondary | ICD-10-CM | POA: Diagnosis not present

## 2023-06-29 MED ORDER — IOHEXOL 9 MG/ML PO SOLN
1000.0000 mL | ORAL | Status: AC
  Administered 2023-06-29: 1000 mL via ORAL

## 2023-06-29 MED ORDER — IOHEXOL 300 MG/ML  SOLN
100.0000 mL | Freq: Once | INTRAMUSCULAR | Status: AC | PRN
  Administered 2023-06-29: 100 mL via INTRAVENOUS

## 2023-06-29 MED ORDER — IOHEXOL 9 MG/ML PO SOLN
ORAL | Status: AC
  Filled 2023-06-29: qty 1000

## 2023-06-29 NOTE — Telephone Encounter (Signed)
 I notified Carolyn Massey and her daughter June by phone regarding lab results from recent [facility] visit. Labs show mildly elevated LDH at 201 and anemia with Hgb 11.4. . I reviewed PCP's referral which shows anemia has been present since at least 08/2022. At that time hemoglobin was 11.3. Patient denies any active bleeding. Labs do not indicate immunity to Hepatitis B and patient will need to repeat the series with PCP after this work up is complete Patient had mammogram of bilateral breasts yesterday showing bilateral axillary/outer breast lymph nodes with borderline cortical thickening. Biopsy was not performed as patient is scheduled for CT chest/abdomen/pelvis later today. Pending scan results we will be able to determine appropriate biopsy site. All of patient's questions were answered and she expressed understanding of the plan provided.

## 2023-06-29 NOTE — Progress Notes (Signed)
 CT CAP resulted showing multiple enlarged lymph nodes in patient's chest, abdomen, and pelvis.  I reviewed results with Dr. Leonides Schanz who agrees with plan for urgent ENT referral for excisional biopsy. Daughter updated and agreeable with plan.

## 2023-07-02 ENCOUNTER — Other Ambulatory Visit

## 2023-07-04 ENCOUNTER — Encounter (HOSPITAL_COMMUNITY): Payer: Self-pay | Admitting: Surgery

## 2023-07-04 ENCOUNTER — Other Ambulatory Visit: Payer: Self-pay

## 2023-07-04 ENCOUNTER — Telehealth (INDEPENDENT_AMBULATORY_CARE_PROVIDER_SITE_OTHER): Payer: Self-pay | Admitting: Otolaryngology

## 2023-07-04 ENCOUNTER — Ambulatory Visit: Payer: Self-pay | Admitting: Surgery

## 2023-07-04 ENCOUNTER — Institutional Professional Consult (permissible substitution) (INDEPENDENT_AMBULATORY_CARE_PROVIDER_SITE_OTHER): Admitting: Otolaryngology

## 2023-07-04 DIAGNOSIS — R590 Localized enlarged lymph nodes: Secondary | ICD-10-CM | POA: Diagnosis not present

## 2023-07-04 NOTE — H&P (View-Only) (Signed)
 History of Present Illness: Carolyn Massey is a 83 y.o. female who is seen today as an office consultation for evaluation of New Consultation (multiple enlarged lymph nodes in chest, abdomen and pelvis)   She was noted to have inguinal adenopathy on a routine exam by her PCP last month. An ultrasound confirmed enlarged lymph nodes bilaterally. She was referred to the rapid diagnostic clinic, and then sent for a CT chest/abd/pelvis. This showed enlarged lymph nodes in the inguinal area bilaterally, retroperitoneum, axilla and thorax, concerning a lymphoproliferative process or metastatic disease. She has been referred for an excisional biopsy. The patient says that she feels well and has not had any new symptoms. She denies loss of appetite, unplanned weight loss, fevers, chills and night sweats.   She does not take any blood thinners and is in good health.       Review of Systems: A complete review of systems was obtained from the patient.  I have reviewed this information and discussed as appropriate with the patient.  See HPI as well for other ROS.     Medical History: Past Medical History Past Medical History: Diagnosis Date  Arthritis    BCC (basal cell carcinoma of skin)    SCC (squamous cell carcinoma)         Problem List There is no problem list on file for this patient.     Past Surgical History Past Surgical History: Procedure Laterality Date  MOHS SURGERY       Several over the years      Allergies No Known Allergies    Medications Ordered Prior to Encounter No current outpatient medications on file prior to visit.    No current facility-administered medications on file prior to visit.      Family History Family History Problem Relation Age of Onset  Coronary Artery Disease (Blocked arteries around heart) Father    Skin cancer Brother    Obesity Brother        Tobacco Use History Social History    Tobacco Use Smoking Status Never Smokeless  Tobacco Never      Social History Social History    Socioeconomic History  Marital status: Married Tobacco Use  Smoking status: Never  Smokeless tobacco: Never Vaping Use  Vaping status: Never Used Substance and Sexual Activity  Alcohol use: Not Currently  Drug use: Never    Social Drivers of Health    Housing Stability: Unknown (07/04/2023)   Housing Stability Vital Sign    Homeless in the Last Year: No      Objective:   Vitals:   07/04/23 1044 BP: (!) 167/65 Pulse: 68 Temp: 36.3 C (97.3 F) SpO2: 99% Weight: 53 kg (116 lb 12.8 oz) Height: 165.1 cm (5\' 5" ) PainSc: 0-No pain   Body mass index is 19.44 kg/m.   Physical Exam Vitals reviewed.  Constitutional:      General: She is not in acute distress.    Appearance: Normal appearance.  HENT:     Head: Normocephalic and atraumatic.  Pulmonary:     Effort: Pulmonary effort is normal. No respiratory distress.     Breath sounds: Normal breath sounds. No wheezing.  Lymphadenopathy:     Comments: Palpable bilateral inguinal adenopathy, more pronounced on the right. Axillary nodes are not readily palpable.  Skin:    General: Skin is warm and dry.  Neurological:     General: No focal deficit present.     Mental Status: She is alert and oriented to  person, place, and time.            Labs, Imaging and Diagnostic Testing: CT chest/abd/pelvis 06/29/23: IMPRESSION: 1. There are multiple enlarged lymph nodes in the chest, abdomen and pelvis, as described above. These are nonspecific and differential diagnosis includes lymphoproliferative disorder, metastases, etc. 2. Multiple other nonacute observations, as described above.       Assessment and Plan:   Assessment Diagnoses and all orders for this visit:   Inguinal lymphadenopathy -     CCS Case Posting Request; Future     83 yo female with diffuse lymphadenopathy. She has been referred for an excisional biopsy to evaluate for a malignant process.  She has easily palpable inguinal lymph nodes, thus I recommended an inguinal lymph node biopsy on the right side. I reviewed the details of this procedure, including the benefits and the risks of bleeding, infection and postoperative lymphocele. She expressed understanding and agrees to proceed. This will be scheduled for tomorrow morning at [facility]. All questions were answered.  Karleen Overall, MD [facility] General, Hepatobiliary and Pancreatic Surgery 07/04/23 11:23 AM

## 2023-07-04 NOTE — H&P (Signed)
 History of Present Illness: Carolyn Massey is a 83 y.o. female who is seen today as an office consultation for evaluation of New Consultation (multiple enlarged lymph nodes in chest, abdomen and pelvis)   She was noted to have inguinal adenopathy on a routine exam by her PCP last month. An ultrasound confirmed enlarged lymph nodes bilaterally. She was referred to the rapid diagnostic clinic, and then sent for a CT chest/abd/pelvis. This showed enlarged lymph nodes in the inguinal area bilaterally, retroperitoneum, axilla and thorax, concerning a lymphoproliferative process or metastatic disease. She has been referred for an excisional biopsy. The patient says that she feels well and has not had any new symptoms. She denies loss of appetite, unplanned weight loss, fevers, chills and night sweats.   She does not take any blood thinners and is in good health.       Review of Systems: A complete review of systems was obtained from the patient.  I have reviewed this information and discussed as appropriate with the patient.  See HPI as well for other ROS.     Medical History: Past Medical History Past Medical History: Diagnosis Date  Arthritis    BCC (basal cell carcinoma of skin)    SCC (squamous cell carcinoma)         Problem List There is no problem list on file for this patient.     Past Surgical History Past Surgical History: Procedure Laterality Date  MOHS SURGERY       Several over the years      Allergies No Known Allergies    Medications Ordered Prior to Encounter No current outpatient medications on file prior to visit.    No current facility-administered medications on file prior to visit.      Family History Family History Problem Relation Age of Onset  Coronary Artery Disease (Blocked arteries around heart) Father    Skin cancer Brother    Obesity Brother        Tobacco Use History Social History    Tobacco Use Smoking Status Never Smokeless  Tobacco Never      Social History Social History    Socioeconomic History  Marital status: Married Tobacco Use  Smoking status: Never  Smokeless tobacco: Never Vaping Use  Vaping status: Never Used Substance and Sexual Activity  Alcohol use: Not Currently  Drug use: Never    Social Drivers of Health    Housing Stability: Unknown (07/04/2023)   Housing Stability Vital Sign    Homeless in the Last Year: No      Objective:   Vitals:   07/04/23 1044 BP: (!) 167/65 Pulse: 68 Temp: 36.3 C (97.3 F) SpO2: 99% Weight: 53 kg (116 lb 12.8 oz) Height: 165.1 cm (5\' 5" ) PainSc: 0-No pain   Body mass index is 19.44 kg/m.   Physical Exam Vitals reviewed.  Constitutional:      General: She is not in acute distress.    Appearance: Normal appearance.  HENT:     Head: Normocephalic and atraumatic.  Pulmonary:     Effort: Pulmonary effort is normal. No respiratory distress.     Breath sounds: Normal breath sounds. No wheezing.  Lymphadenopathy:     Comments: Palpable bilateral inguinal adenopathy, more pronounced on the right. Axillary nodes are not readily palpable.  Skin:    General: Skin is warm and dry.  Neurological:     General: No focal deficit present.     Mental Status: She is alert and oriented to  person, place, and time.            Labs, Imaging and Diagnostic Testing: CT chest/abd/pelvis 06/29/23: IMPRESSION: 1. There are multiple enlarged lymph nodes in the chest, abdomen and pelvis, as described above. These are nonspecific and differential diagnosis includes lymphoproliferative disorder, metastases, etc. 2. Multiple other nonacute observations, as described above.       Assessment and Plan:   Assessment Diagnoses and all orders for this visit:   Inguinal lymphadenopathy -     CCS Case Posting Request; Future     83 yo female with diffuse lymphadenopathy. She has been referred for an excisional biopsy to evaluate for a malignant process.  She has easily palpable inguinal lymph nodes, thus I recommended an inguinal lymph node biopsy on the right side. I reviewed the details of this procedure, including the benefits and the risks of bleeding, infection and postoperative lymphocele. She expressed understanding and agrees to proceed. This will be scheduled for tomorrow morning at [facility]. All questions were answered.  Karleen Overall, MD [facility] General, Hepatobiliary and Pancreatic Surgery 07/04/23 11:23 AM

## 2023-07-04 NOTE — Progress Notes (Signed)
 PCP - Dr Minus Amel Cardiologist - none  CT Chest/ABD/Pelvis - 06/29/23 EKG - n/a Stress Test - n/a ECHO - n/a Cardiac Cath - n/a  ICD Pacemaker/Loop - n/a  Sleep Study -  n/a  Diabetes - n/a  Aspirin & Blood Thinner Instructions:  n/a  ERAS - clear liquids til 7:30 AM DOS  Anesthesia review: no  STOP now taking any Aspirin (unless otherwise instructed by your surgeon), Aleve, Naproxen, Ibuprofen, Motrin, Advil, Goody's, BC's, all herbal medications, fish oil, and all vitamins.   Coronavirus Screening Does the patient have any of the following symptoms:  Cough yes/no: No Fever (>100.68F)  yes/no: No Runny nose yes/no: No Sore throat yes/no: No Difficulty breathing/shortness of breath  yes/no: No  Has the patient traveled in the last 14 days and where? yes/no: No  Spoke with Daughter Carolyn Massey who verbalized understanding of instructions that were given via phone.

## 2023-07-04 NOTE — Telephone Encounter (Signed)
 Received call from Kaitlyn Walisiewicz, PA from the cancer center to cancel today's appointment (07/04/23 @12 :45 pm) for the patient. She stated she spoke with Dr. Soldatova on 04/15, and they both agreed that it wasn't necessary for ENT to see the patient. Notified MA that appointment was being canceled.

## 2023-07-05 ENCOUNTER — Encounter (HOSPITAL_COMMUNITY)

## 2023-07-05 ENCOUNTER — Other Ambulatory Visit (HOSPITAL_COMMUNITY)

## 2023-07-05 ENCOUNTER — Ambulatory Visit (HOSPITAL_COMMUNITY): Admitting: Anesthesiology

## 2023-07-05 ENCOUNTER — Ambulatory Visit (HOSPITAL_BASED_OUTPATIENT_CLINIC_OR_DEPARTMENT_OTHER): Admitting: Anesthesiology

## 2023-07-05 ENCOUNTER — Encounter (HOSPITAL_COMMUNITY): Payer: Self-pay | Admitting: Surgery

## 2023-07-05 ENCOUNTER — Encounter (HOSPITAL_COMMUNITY)
Admission: RE | Disposition: A | Discharge status: Home / Self Care | Payer: Self-pay | Source: Home / Self Care | Attending: Surgery

## 2023-07-05 ENCOUNTER — Ambulatory Visit (HOSPITAL_COMMUNITY)
Admission: RE | Admit: 2023-07-05 | Discharge: 2023-07-05 | Disposition: A | Discharge status: Home / Self Care | Attending: Surgery | Admitting: Surgery

## 2023-07-05 DIAGNOSIS — C8295 Follicular lymphoma, unspecified, lymph nodes of inguinal region and lower limb: Secondary | ICD-10-CM | POA: Insufficient documentation

## 2023-07-05 DIAGNOSIS — D479 Neoplasm of uncertain behavior of lymphoid, hematopoietic and related tissue, unspecified: Secondary | ICD-10-CM | POA: Diagnosis not present

## 2023-07-05 DIAGNOSIS — R590 Localized enlarged lymph nodes: Secondary | ICD-10-CM

## 2023-07-05 DIAGNOSIS — C8205 Follicular lymphoma grade I, lymph nodes of inguinal region and lower limb: Secondary | ICD-10-CM | POA: Diagnosis not present

## 2023-07-05 HISTORY — DX: Anemia, unspecified: D64.9

## 2023-07-05 HISTORY — PX: SENTINEL NODE BIOPSY: SHX6608

## 2023-07-05 HISTORY — DX: Unspecified osteoarthritis, unspecified site: M19.90

## 2023-07-05 SURGERY — BIOPSY, LYMPH NODE, SENTINEL
Anesthesia: Monitor Anesthesia Care | Site: Groin | Laterality: Right

## 2023-07-05 MED ORDER — ORAL CARE MOUTH RINSE: 15.0000 mL | Freq: Once | OROMUCOSAL | Status: AC

## 2023-07-05 MED ORDER — TRAMADOL HCL 50 MG PO TABS: 50.0000 mg | ORAL_TABLET | Freq: Three times a day (TID) | ORAL | 0 refills | Status: AC | PRN

## 2023-07-05 MED ORDER — BUPIVACAINE-EPINEPHRINE (PF) 0.25% -1:200000 IJ SOLN
INTRAMUSCULAR | Status: DC | PRN
  Administered 2023-07-05: 16 mL

## 2023-07-05 MED ORDER — CHLORHEXIDINE GLUCONATE 0.12 % MT SOLN
15.0000 mL | Freq: Once | OROMUCOSAL | Status: AC
  Administered 2023-07-05: 15 mL via OROMUCOSAL
  Filled 2023-07-05: qty 15

## 2023-07-05 MED ORDER — PROPOFOL 10 MG/ML IV BOLUS
INTRAVENOUS | Status: DC | PRN
Start: 2023-07-05 — End: 2023-07-05
  Administered 2023-07-05: 50 mg via INTRAVENOUS
  Administered 2023-07-05: 20 mg via INTRAVENOUS
  Administered 2023-07-05: 50 mg via INTRAVENOUS

## 2023-07-05 MED ORDER — LACTATED RINGERS IV SOLN: INTRAVENOUS | Status: DC

## 2023-07-05 MED ORDER — ISOSULFAN BLUE 1 % ~~LOC~~ SOLN
5.0000 mg | Freq: Once | SUBCUTANEOUS | Status: DC
  Filled 2023-07-05: qty 5

## 2023-07-05 MED ORDER — ACETAMINOPHEN 500 MG PO TABS
1000.0000 mg | ORAL_TABLET | ORAL | Status: AC
  Administered 2023-07-05: 1000 mg via ORAL
  Filled 2023-07-05: qty 2

## 2023-07-05 MED ORDER — PROPOFOL 500 MG/50ML IV EMUL
INTRAVENOUS | Status: DC | PRN
  Administered 2023-07-05: 100 ug/kg/min via INTRAVENOUS

## 2023-07-05 MED ORDER — CEFAZOLIN SODIUM-DEXTROSE 2-4 GM/100ML-% IV SOLN
2.0000 g | INTRAVENOUS | Status: AC
  Administered 2023-07-05: 2 g via INTRAVENOUS
  Filled 2023-07-05: qty 100

## 2023-07-05 MED ORDER — LIDOCAINE 2% (20 MG/ML) 5 ML SYRINGE
INTRAMUSCULAR | Status: DC | PRN
  Administered 2023-07-05: 50 mg via INTRAVENOUS

## 2023-07-05 MED ORDER — ONDANSETRON HCL 4 MG/2ML IJ SOLN
INTRAMUSCULAR | Status: DC | PRN
  Administered 2023-07-05: 4 mg via INTRAVENOUS

## 2023-07-05 SURGICAL SUPPLY — 40 items
BAG COUNTER SPONGE SURGICOUNT (BAG) ×1 IMPLANT
CANISTER SUCT 3000ML PPV (MISCELLANEOUS) ×1 IMPLANT
CHLORAPREP W/TINT 26 (MISCELLANEOUS) ×2 IMPLANT
CLIP TI WIDE RED SMALL 24 (CLIP) ×1 IMPLANT
CNTNR URN SCR LID CUP LEK RST (MISCELLANEOUS) IMPLANT
COVER PROBE W GEL 5X96 (DRAPES) ×1 IMPLANT
COVER SURGICAL LIGHT HANDLE (MISCELLANEOUS) ×1 IMPLANT
DERMABOND ADVANCED .7 DNX12 (GAUZE/BANDAGES/DRESSINGS) ×1 IMPLANT
DRAPE CHEST BREAST 15X10 FENES (DRAPES) ×1 IMPLANT
DRAPE LAPAROSCOPIC ABDOMINAL (DRAPES) IMPLANT
DRSG TEGADERM 4X4.75 (GAUZE/BANDAGES/DRESSINGS) IMPLANT
DRSG TELFA 3X8 NADH STRL (GAUZE/BANDAGES/DRESSINGS) IMPLANT
ELECT REM PT RETURN 9FT ADLT (ELECTROSURGICAL) ×1 IMPLANT
ELECTRODE REM PT RTRN 9FT ADLT (ELECTROSURGICAL) ×1 IMPLANT
GAUZE SPONGE 2X2 8PLY STRL LF (GAUZE/BANDAGES/DRESSINGS) IMPLANT
GLOVE BIOGEL PI IND STRL 6 (GLOVE) ×1 IMPLANT
GLOVE BIOGEL PI MICRO STRL 5.5 (GLOVE) ×1 IMPLANT
GOWN STRL REUS W/ TWL LRG LVL3 (GOWN DISPOSABLE) ×2 IMPLANT
KIT BASIN OR (CUSTOM PROCEDURE TRAY) ×1 IMPLANT
KIT TURNOVER KIT B (KITS) ×1 IMPLANT
MARKER SKIN DUAL TIP RULER LAB (MISCELLANEOUS) ×1 IMPLANT
NDL 18GX1X1/2 (RX/OR ONLY) (NEEDLE) ×1 IMPLANT
NDL FILTER BLUNT 18X1 1/2 (NEEDLE) IMPLANT
NDL HYPO 25GX1X1/2 BEV (NEEDLE) ×2 IMPLANT
NEEDLE 18GX1X1/2 (RX/OR ONLY) (NEEDLE) ×1 IMPLANT
NEEDLE FILTER BLUNT 18X1 1/2 (NEEDLE) IMPLANT
NEEDLE HYPO 25GX1X1/2 BEV (NEEDLE) ×2 IMPLANT
NS IRRIG 1000ML POUR BTL (IV SOLUTION) ×1 IMPLANT
PACK GENERAL/GYN (CUSTOM PROCEDURE TRAY) ×1 IMPLANT
PAD ARMBOARD POSITIONER FOAM (MISCELLANEOUS) ×2 IMPLANT
PENCIL SMOKE EVACUATOR (MISCELLANEOUS) IMPLANT
SUT ETHILON 2 0 FS 18 (SUTURE) IMPLANT
SUT ETHILON 3 0 FSL (SUTURE) IMPLANT
SUT MNCRL AB 4-0 PS2 18 (SUTURE) ×1 IMPLANT
SUT SILK 2 0 SH (SUTURE) ×1 IMPLANT
SUT SILK 3 0 SH 30 (SUTURE) IMPLANT
SUT VIC AB 3-0 SH 8-18 (SUTURE) IMPLANT
SYR CONTROL 10ML LL (SYRINGE) ×2 IMPLANT
TOWEL GREEN STERILE (TOWEL DISPOSABLE) ×1 IMPLANT
TOWEL GREEN STERILE FF (TOWEL DISPOSABLE) ×1 IMPLANT

## 2023-07-05 NOTE — Anesthesia Preprocedure Evaluation (Signed)
 Anesthesia Evaluation  Patient identified by MRN, date of birth, ID band Patient awake    Reviewed: Allergy & Precautions, NPO status , Patient's Chart, lab work & pertinent test results  Airway Mallampati: II  TM Distance: >3 FB Neck ROM: Full    Dental no notable dental hx.    Pulmonary neg pulmonary ROS   Pulmonary exam normal        Cardiovascular negative cardio ROS  Rhythm:Regular Rate:Normal     Neuro/Psych negative neurological ROS  negative psych ROS   GI/Hepatic Neg liver ROS,,,Inguinal lymphadenopathy    Endo/Other  negative endocrine ROS    Renal/GU negative Renal ROS  negative genitourinary   Musculoskeletal  (+) Arthritis , Osteoarthritis,    Abdominal Normal abdominal exam  (+)   Peds  Hematology  (+) Blood dyscrasia, anemia Lab Results      Component                Value               Date                      WBC                      4.7                 06/27/2023                HGB                      11.4 (L)            06/27/2023                HCT                      34.4 (L)            06/27/2023                MCV                      85.4                06/27/2023                PLT                      163                 06/27/2023              Anesthesia Other Findings   Reproductive/Obstetrics                             Anesthesia Physical Anesthesia Plan  ASA: 2  Anesthesia Plan: MAC   Post-op Pain Management:    Induction: Intravenous  PONV Risk Score and Plan: 2 and Ondansetron, Dexamethasone, Propofol infusion and Treatment may vary due to age or medical condition  Airway Management Planned: Simple Face Mask and Nasal Cannula  Additional Equipment: None  Intra-op Plan:   Post-operative Plan:   Informed Consent: I have reviewed the patients History and Physical, chart, labs and discussed the procedure including the risks, benefits and  alternatives for the proposed anesthesia with the patient or authorized representative who  has indicated his/her understanding and acceptance.     Interpreter used for interview  Plan Discussed with: CRNA  Anesthesia Plan Comments:        Anesthesia Quick Evaluation

## 2023-07-05 NOTE — Discharge Instructions (Addendum)
 CENTRAL  SURGERY DISCHARGE INSTRUCTIONS  Activity Ok to shower in 24 hours, but do not bathe or submerge incision underwater for 2 weeks. Do not drive while taking narcotic pain medication. You may drive when you are no longer taking prescription pain medication, you can comfortably wear a seatbelt, and you can safely maneuver your car and apply brakes.  Wound Care Your incisions are covered with skin glue called Dermabond. This will peel off on its own over time. You may shower and allow warm soapy water to run over your incisions. Gently pat dry. Do not submerge your incision underwater until cleared by your surgeon. Monitor your incision for any new redness, tenderness, or drainage. Many patients will experience some swelling and bruising at the incisions.  Ice packs will help.  Swelling and bruising can take several days to resolve.   Medications A  prescription for pain medication may be given to you upon discharge.  Take your pain medication as prescribed, if needed.  If narcotic pain medicine is not needed, then you may take acetaminophen (Tylenol) or ibuprofen (Advil) as needed. It is common to experience some constipation if taking pain medication after surgery.  Increasing fluid intake and taking a stool softener (such as Colace) will usually help or prevent this problem from occurring.  A mild laxative (Milk of Magnesia or Miralax) should be taken according to package directions if there are no bowel movements after 48 hours. Take your usually prescribed medications unless otherwise directed. If you need a refill on your pain medication, please contact your pharmacy.  They will contact our office to request authorization. Prescriptions will not be filled after 5 pm or on weekends.  When to Call Us : Fever greater than 100.5 New redness, drainage, or swelling at incision site Severe pain, nausea, or vomiting Persistent bleeding from incisions  Follow-up You have an  appointment scheduled with Dr. Leighton Punches on Aug 01, 2023 at 9:20am. This will be at the [facility] office at [address], [place], [place]. Please arrive at least 15 minutes prior to your scheduled appointment time.  The clinic staff is available to answer your questions during regular business hours.  Please don't hesitate to call and ask to speak to one of the nurses for clinical concerns.  If you have a medical emergency, go to the nearest emergency room or call 911.  A surgeon from [facility] is always on call at the hospital  [address], [place], [place]  [address] ?  [address] [address], [place], [place]   [address] [phone] ? Toll Free: [phone] ? FAX [phone] Web site: [email or web address]      Managing Your Pain After Surgery Without Opioids    Thank you for participating in our program to help patients manage their pain after surgery without opioids. This is part of our effort to provide you with the best care possible, without exposing you or your family to the risk that opioids pose.  What pain can I expect after surgery? You can expect to have some pain after surgery. This is normal. The pain is typically worse the day after surgery, and quickly begins to get better. Many studies have found that many patients are able to manage their pain after surgery with Over-the-Counter (OTC) medications such as Tylenol and Motrin. If you have a condition that does not allow you to take Tylenol or Motrin, notify your surgical team.  How will I manage my  pain? The best strategy for controlling your pain after surgery is around the clock pain control with Tylenol (acetaminophen) and Motrin (ibuprofen or Advil). Alternating these medications with each other allows you to maximize your pain control. In addition to Tylenol and Motrin, you can use heating pads or ice packs on your incisions to help reduce your pain.  How  will I alternate your regular strength over-the-counter pain medication? You will take a dose of pain medication every three hours. Start by taking 650 mg of Tylenol (2 pills of 325 mg) 3 hours later take 600 mg of Motrin (3 pills of 200 mg) 3 hours after taking the Motrin take 650 mg of Tylenol 3 hours after that take 600 mg of Motrin.   - 1 -  See example - if your first dose of Tylenol is at 12:00 PM   12:00 PM Tylenol 650 mg (2 pills of 325 mg)  3:00 PM Motrin 600 mg (3 pills of 200 mg)  6:00 PM Tylenol 650 mg (2 pills of 325 mg)  9:00 PM Motrin 600 mg (3 pills of 200 mg)  Continue alternating every 3 hours   We recommend that you follow this schedule around-the-clock for at least 3 days after surgery, or until you feel that it is no longer needed. Use the table on the last page of this handout to keep track of the medications you are taking. Important: Do not take more than 3000mg  of Tylenol or 3200mg  of Motrin in a 24-hour period. Do not take ibuprofen/Motrin if you have a history of bleeding stomach ulcers, severe kidney disease, &/or actively taking a blood thinner  What if I still have pain? If you have pain that is not controlled with the over-the-counter pain medications (Tylenol and Motrin or Advil) you might have what we call "breakthrough" pain. You will receive a prescription for a small amount of an opioid pain medication such as Oxycodone, Tramadol, or Tylenol with Codeine. Use these opioid pills in the first 24 hours after surgery if you have breakthrough pain. Do not take more than 1 pill every 4-6 hours.  If you still have uncontrolled pain after using all opioid pills, don't hesitate to call our staff using the number provided. We will help make sure you are managing your pain in the best way possible, and if necessary, we can provide a prescription for additional pain medication.   Day 1    Time  Name of Medication Number of pills taken  Amount of Acetaminophen   Pain Level   Comments  AM PM       AM PM       AM PM       AM PM       AM PM       AM PM       AM PM       AM PM       Total Daily amount of Acetaminophen Do not take more than  3,000 mg per day      Day 2    Time  Name of Medication Number of pills taken  Amount of Acetaminophen  Pain Level   Comments  AM PM       AM PM       AM PM       AM PM       AM PM       AM PM       AM PM  AM PM       Total Daily amount of Acetaminophen Do not take more than  3,000 mg per day      Day 3    Time  Name of Medication Number of pills taken  Amount of Acetaminophen  Pain Level   Comments  AM PM       AM PM       AM PM       AM PM         AM PM       AM PM       AM PM       AM PM       Total Daily amount of Acetaminophen Do not take more than  3,000 mg per day      Day 4    Time  Name of Medication Number of pills taken  Amount of Acetaminophen  Pain Level   Comments  AM PM       AM PM       AM PM       AM PM       AM PM       AM PM       AM PM       AM PM       Total Daily amount of Acetaminophen Do not take more than  3,000 mg per day      Day 5    Time  Name of Medication Number of pills taken  Amount of Acetaminophen  Pain Level   Comments  AM PM       AM PM       AM PM       AM PM       AM PM       AM PM       AM PM       AM PM       Total Daily amount of Acetaminophen Do not take more than  3,000 mg per day      Day 6    Time  Name of Medication Number of pills taken  Amount of Acetaminophen  Pain Level  Comments  AM PM       AM PM       AM PM       AM PM       AM PM       AM PM       AM PM       AM PM       Total Daily amount of Acetaminophen Do not take more than  3,000 mg per day      Day 7    Time  Name of Medication Number of pills taken  Amount of Acetaminophen  Pain Level   Comments  AM PM       AM PM       AM PM       AM PM       AM PM       AM PM       AM PM       AM PM        Total Daily amount of Acetaminophen Do not take more than  3,000 mg per day        For additional information about how and where to safely dispose of unused opioid medications - [employer]  Disclaimer: This document contains information and/or  instructional materials adapted from [facility] for the typical patient with your condition. It does not replace medical advice from your health care provider because your experience may differ from that of the typical patient. Talk to your health care provider if you have any questions about this document, your condition or your treatment plan. Adapted from [facility]

## 2023-07-05 NOTE — Op Note (Signed)
 Date: 07/05/23  Patient: Carolyn Massey MRN: [id number]  Preoperative Diagnosis: Inguinal lymphadenopathy Postoperative Diagnosis: Same  Procedure: Right inguinal excisional lymph node biopsy  Surgeon: Karleen Overall, MD  EBL: Minimal  Anesthesia: Monitored anesthesia care  Specimens: Right inguinal lymph node  Indications: [name] is an 83 yo female who was incidentally found to have inguinal adenopathy on exam. CT imaging showed diffuse lymphadenopathy concerning for a lymphoproliferative process. She was referred for an excisional biopsy and after a discussion of the risks and benefits of surgery, agreed to proceed.  Findings: Right inguinal excisional lymph node biopsy performed.  Procedure details: Informed consent was obtained in the preoperative area prior to the procedure. The patient was brought to the operating room and placed on the table in the supine position. Sedation was administered and perioperative antibiotics were administered per SCIP guidelines. The groin was prepped and draped in the usual sterile fashion. A pre-procedure timeout was taken verifying patient identity, surgical site and procedure to be performed.  The skin in the right groin was infiltrated with bupivicaine. A small vertical skin incision was made in the right groin overlying the palpable adenopathy. The subcutaneous tissue was divided with cautery. Scarpa's fascia was opened with cautery. Just deep to this, there was an enlarged lymph node. The node was grasped and dissected out of the surrounding subcutaneous tissue bluntly. Visible lymphatic vessels were clipped prior to division. A small venous branch was clipped. The node was completely excised and sent fresh to pathology. Hemostasis was achieved with cautery. Scarpa's layer and the deep dermis were each closed with interrupted 3-0 Vicryl suture. The subcuticular layer was closed with running 4-0 monocryl. Dermabond was applied.  The patient  tolerated the procedure well with no apparent complications. All counts were correct x2 at the end of the procedure. The patient was taken to PACU in stable condition.  Karleen Overall, MD 07/05/23 12:17 PM

## 2023-07-05 NOTE — Transfer of Care (Signed)
 Immediate Anesthesia Transfer of Care Note  Patient: Carolyn Massey  Procedure(s) Performed: BIOPSY, LYMPH NODE, SENTINEL (Right: Groin)  Patient Location: PACU  Anesthesia Type:MAC  Level of Consciousness: awake and drowsy  Airway & Oxygen Therapy: Patient connected to face mask oxygen  Post-op Assessment: Post -op Vital signs reviewed and stable  Post vital signs: stable  Last Vitals:  Vitals Value Taken Time  BP 131/55 07/05/23 1215  Temp    Pulse 71 07/05/23 1218  Resp 15 07/05/23 1218  SpO2 99 % 07/05/23 1218  Vitals shown include unfiled device data.  Last Pain:  Vitals:   07/05/23 1213  TempSrc:   PainSc: 0-No pain         Complications: There were no known notable events for this encounter.

## 2023-07-05 NOTE — Anesthesia Postprocedure Evaluation (Signed)
 Anesthesia Post Note  Patient: Carolyn Massey  Procedure(s) Performed: BIOPSY, LYMPH NODE, SENTINEL (Right: Groin)     Patient location during evaluation: PACU Anesthesia Type: MAC Level of consciousness: awake and alert Pain management: pain level controlled Vital Signs Assessment: post-procedure vital signs reviewed and stable Respiratory status: spontaneous breathing, nonlabored ventilation, respiratory function stable and patient connected to nasal cannula oxygen Cardiovascular status: stable and blood pressure returned to baseline Postop Assessment: no apparent nausea or vomiting Anesthetic complications: no   There were no known notable events for this encounter.  Last Vitals:  Vitals:   07/05/23 1215 07/05/23 1230  BP: (!) 131/55 136/63  Pulse: 72 67  Resp: 18 16  Temp:  36.4 C  SpO2: 99% 98%    Last Pain:  Vitals:   07/05/23 1230  TempSrc:   PainSc: 0-No pain                 Theotis Flake P Alyssa Rotondo

## 2023-07-05 NOTE — Interval H&P Note (Signed)
 History and Physical Interval Note:  07/05/2023 11:03 AM  Carolyn Massey  has presented today for surgery, with the diagnosis of INGUINAL LYMPHADENOPATHY.  The various methods of treatment have been discussed with the patient and family. After consideration of risks, benefits and other options for treatment, the patient has consented to right inguinal excisional lymph node biopsy as a surgical intervention.  The patient's history has been reviewed, patient examined, no change in status, stable for surgery.  I have reviewed the patient's chart and labs.  Questions were answered to the patient's satisfaction.     Lujean Sake

## 2023-07-06 ENCOUNTER — Encounter (HOSPITAL_COMMUNITY): Payer: Self-pay | Admitting: Surgery

## 2023-07-09 LAB — SURGICAL PATHOLOGY

## 2023-07-11 ENCOUNTER — Telehealth: Payer: Self-pay | Admitting: Physician Assistant

## 2023-07-11 DIAGNOSIS — D225 Melanocytic nevi of trunk: Secondary | ICD-10-CM | POA: Diagnosis not present

## 2023-07-11 DIAGNOSIS — Z85828 Personal history of other malignant neoplasm of skin: Secondary | ICD-10-CM | POA: Diagnosis not present

## 2023-07-11 DIAGNOSIS — C44722 Squamous cell carcinoma of skin of right lower limb, including hip: Secondary | ICD-10-CM | POA: Diagnosis not present

## 2023-07-11 DIAGNOSIS — C44319 Basal cell carcinoma of skin of other parts of face: Secondary | ICD-10-CM | POA: Diagnosis not present

## 2023-07-11 DIAGNOSIS — L821 Other seborrheic keratosis: Secondary | ICD-10-CM | POA: Diagnosis not present

## 2023-07-11 DIAGNOSIS — Z86006 Personal history of melanoma in-situ: Secondary | ICD-10-CM | POA: Diagnosis not present

## 2023-07-11 DIAGNOSIS — L570 Actinic keratosis: Secondary | ICD-10-CM | POA: Diagnosis not present

## 2023-07-11 DIAGNOSIS — D485 Neoplasm of uncertain behavior of skin: Secondary | ICD-10-CM | POA: Diagnosis not present

## 2023-07-11 DIAGNOSIS — C44629 Squamous cell carcinoma of skin of left upper limb, including shoulder: Secondary | ICD-10-CM | POA: Diagnosis not present

## 2023-07-11 DIAGNOSIS — Z7189 Other specified counseling: Secondary | ICD-10-CM | POA: Diagnosis not present

## 2023-07-11 DIAGNOSIS — L814 Other melanin hyperpigmentation: Secondary | ICD-10-CM | POA: Diagnosis not present

## 2023-07-11 DIAGNOSIS — Z08 Encounter for follow-up examination after completed treatment for malignant neoplasm: Secondary | ICD-10-CM | POA: Diagnosis not present

## 2023-07-11 NOTE — Telephone Encounter (Signed)
 I notified Carolyn Massey by phone regarding biopsy results. Findings are consistent with low grade follicular lymphoma. Patient is scheduled to follow up with Dr. Rosaline Coma to finalize treatment recommendations on 07/17/23. All of patient's questions were answered and she expressed understanding of the plan provided. Daughter June also present on the call.

## 2023-07-12 LAB — SURGICAL PATHOLOGY

## 2023-07-17 ENCOUNTER — Inpatient Hospital Stay: Admitting: Hematology and Oncology

## 2023-07-17 VITALS — BP 156/73 | HR 69 | Temp 98.0°F | Resp 13 | Wt 117.8 lb

## 2023-07-17 DIAGNOSIS — C8258 Diffuse follicle center lymphoma, lymph nodes of multiple sites: Secondary | ICD-10-CM | POA: Diagnosis not present

## 2023-07-17 DIAGNOSIS — C8290 Follicular lymphoma, unspecified, unspecified site: Secondary | ICD-10-CM | POA: Insufficient documentation

## 2023-07-17 NOTE — Progress Notes (Signed)
 [facility] [phone]   [phone]  PROGRESS NOTE  Patient Care Team: Minus Amel, MD as PCP - General (Family Medicine) Devon Fogo, MD (Inactive) as Consulting Physician (Dermatology) Annella Barrows, RN as Oncology Nurse Navigator (Medical Oncology)  Hematological/Oncological History # Follicular Lymphoma Stage III 06/27/2023: establish with [facility] clinic due to inguinal lymphadenopathy.  06/29/2023: CT C/A/P showed multiple enlarged lymph nodes in the chest, abdomen and pelvis.  Findings showed a right supraclavicular node measuring 12 x 22 mm, right axillary node 11 x 15 mm and hilar region 14 x 18 mm. 07/05/2023: needle biopsy of right inguinal lymph nodes shows low grade follicular lymphoma   Interval History:  Carolyn Massey 83 y.o. female with medical history significant for stage III follicular lymphoma who presents for a follow up visit. The patient's last visit was on 06/27/2023. In the interim since the last visit she had imaging and biopsy which confirmed a stage III low grade follicular lymphoma.  On exam today Carolyn Massey is accompanied by her son and daughter-in-law.  She reports the biopsy went well but she is still sore in that area.  She is not having any leakage or drainage from the incision site.  There is no surrounding erythema and no signs of infection.  She reports she would like it to heal soon so she can begin walking again.  She is not having any fevers, chills, sweats, nausea vomiting or diarrhea.  A full 10 point ROS is otherwise negative.  Her weight remains stable.  The bulk of her discussion focused on the diagnosis of follicular lymphoma and steps moving forward.  Details of this conversation are noted below.  MEDICAL HISTORY:  Past Medical History:  Diagnosis Date   Anemia    Arthritis    shoulder   Basal cell carcinoma 02/15/1994   right shoulder(CX35FU), post neck (CX35FU)   Basal cell carcinoma 01/17/2000    upper Left lip (CX35FU)   Basal cell carcinoma 02/12/2001   superificial-mid chest (CX35FU), behind right shoulder (CX35FU)   Basal cell carcinoma 06/21/2001   ulcerated- Left bulb of nose (CX35FU)   Basal cell carcinoma 10/28/2002   sclerosis- over Right brow-(MOHS), sclerosis- Left tip nose (MOHS)   Basal cell carcinoma 06/30/2003   superificial-RIght inner mid calf (CX35FU)   Basal cell carcinoma 08/15/2005   superificial-Right chest (CX35FU)   Basal cell carcinoma 09/22/2009   superificial-mid spinal (CX35FU), Right nose-(CX35FU)   Basal cell carcinoma 06/21/2011   superificial- Right upper chest (SRT)   Basal cell carcinoma 02/05/2015   above Left upper lip inferior (MOHS), alove Left upper lip superior (MOHS)   Basal cell carcinoma 06/06/2016   sup&nod-Left forehead-(CX35FU), nod-RIght mid back-(CX35FU)   Basal cell carcinoma 04/23/2019   superifcial-RIght scapula, sclerosis-Right chest superior   Nodular basal cell carcinoma (BCC) 06/15/2020   Left Forearm anterior   SCCA (squamous cell carcinoma) of skin 06/15/2020   Dorsum of Nose (in situ)   Squamous cell carcinoma of skin 09/22/2009   in situ-Right collarbone(CX35FU),  insitu-mid chest (CX35FU), insitu-Right tip of nose (CX35FU)   Squamous cell carcinoma of skin 10/09/2017   in situ-Right post shoulder-(CX35FU), in situ-Left cheek (CX35FU)   Squamous cell carcinoma of skin 04/23/2019   insitu-Left upper arm, in situ-  below Right  knee outer    SURGICAL HISTORY: Past Surgical History:  Procedure Laterality Date   EYE SURGERY     to remove cataracts   MOHS SURGERY  x several over the years   SENTINEL NODE BIOPSY Right 07/05/2023   Procedure: BIOPSY, LYMPH NODE, SENTINEL;  Surgeon: Lujean Sake, MD;  Location: [facility] OR;  Service: General;  Laterality: Right;   TOTAL ABDOMINAL HYSTERECTOMY      SOCIAL HISTORY: Social History   Socioeconomic History   Marital status: Married    Spouse name: Not on file    Number of children: Not on file   Years of education: Not on file   Highest education level: Not on file  Occupational History   Not on file  Tobacco Use   Smoking status: Never   Smokeless tobacco: Never  Vaping Use   Vaping status: Never Used  Substance and Sexual Activity   Alcohol use: Never   Drug use: Never   Sexual activity: Not Currently    Birth control/protection: Surgical    Comment: Hysterectomy  Other Topics Concern   Not on file  Social History Narrative   Not on file   Social Drivers of Health   Financial Resource Strain: Not on file  Food Insecurity: Not on file  Transportation Needs: Not on file  Physical Activity: Not on file  Stress: Not on file  Social Connections: Not on file  Intimate Partner Violence: Not on file    FAMILY HISTORY: No family history on file.  ALLERGIES:  has no known allergies.  MEDICATIONS:  No current outpatient medications on file.   No current facility-administered medications for this visit.    REVIEW OF SYSTEMS:   Constitutional: ( - ) fevers, ( - )  chills , ( - ) night sweats Eyes: ( - ) blurriness of vision, ( - ) double vision, ( - ) watery eyes Ears, nose, mouth, throat, and face: ( - ) mucositis, ( - ) sore throat Respiratory: ( - ) cough, ( - ) dyspnea, ( - ) wheezes Cardiovascular: ( - ) palpitation, ( - ) chest discomfort, ( - ) lower extremity swelling Gastrointestinal:  ( - ) nausea, ( - ) heartburn, ( - ) change in bowel habits Skin: ( - ) abnormal skin rashes Lymphatics: ( - ) new lymphadenopathy, ( - ) easy bruising Neurological: ( - ) numbness, ( - ) tingling, ( - ) new weaknesses Behavioral/Psych: ( - ) mood change, ( - ) new changes  All other systems were reviewed with the patient and are negative.  PHYSICAL EXAMINATION:  Vitals:   07/17/23 1025  BP: (!) 156/73  Pulse: 69  Resp: 13  Temp: 98 F (36.7 C)  SpO2: 100%   Filed Weights   07/17/23 1025  Weight: 117 lb 12.8 oz (53.4 kg)     GENERAL: Well-appearing elderly Caucasian female, alert, no distress and comfortable SKIN: skin color, texture, turgor are normal, no rashes or significant lesions EYES: conjunctiva are pink and non-injected, sclera clear LUNGS: clear to auscultation and percussion with normal breathing effort HEART: regular rate & rhythm and no murmurs and no lower extremity edema GROIN: Well-healing incision site where right inguinal lymph node was removed. Musculoskeletal: no cyanosis of digits and no clubbing  PSYCH: alert & oriented x 3, fluent speech NEURO: no focal motor/sensory deficits  LABORATORY DATA:  I have reviewed the data as listed    Latest Ref Rng & Units 06/27/2023    1:56 PM 10/02/2018    1:11 AM  CBC  WBC 4.0 - 10.5 K/uL 4.7  6.5   Hemoglobin 12.0 - 15.0 g/dL 16.1  12.7  Hematocrit 36.0 - 46.0 % 34.4  38.5   Platelets 150 - 400 K/uL 163  153        Latest Ref Rng & Units 06/27/2023    1:56 PM 10/02/2018    1:11 AM  CMP  Glucose 70 - 99 mg/dL 90  161   BUN 8 - 23 mg/dL 13  14   Creatinine 0.96 - 1.00 mg/dL 0.45  4.09   Sodium 811 - 145 mmol/L 138  138   Potassium 3.5 - 5.1 mmol/L 4.5  3.9   Chloride 98 - 111 mmol/L 105  108   CO2 22 - 32 mmol/L 27  22   Calcium 8.9 - 10.3 mg/dL 9.4  9.6   Total Protein 6.5 - 8.1 g/dL 6.9    Total Bilirubin 0.0 - 1.2 mg/dL 0.5    Alkaline Phos 38 - 126 U/L 66    AST 15 - 41 U/L 30    ALT 0 - 44 U/L 22      No results found for: "MPROTEIN" No results found for: "KPAFRELGTCHN", "LAMBDASER", "KAPLAMBRATIO"  RADIOGRAPHIC STUDIES: I have personally reviewed the radiological images as listed and agreed with the findings in the report. CT CHEST ABDOMEN PELVIS W CONTRAST Result Date: 06/29/2023 CLINICAL DATA:  [facility] pt. lymphadenopathy work up. EXAM: CT CHEST, ABDOMEN, AND PELVIS WITH CONTRAST TECHNIQUE: Multidetector CT imaging of the chest, abdomen and pelvis was performed following the standard protocol during bolus  administration of intravenous contrast. RADIATION DOSE REDUCTION: This exam was performed according to the departmental dose-optimization program which includes automated exposure control, adjustment of the mA and/or kV according to patient size and/or use of iterative reconstruction technique. CONTRAST:  OMNIPAQUE  IOHEXOL  300 MG/ML  SOLN COMPARISON:  None Available. FINDINGS: CT CHEST FINDINGS Cardiovascular: Normal cardiac size. No pericardial effusion. No aortic aneurysm. There are coronary artery calcifications, in keeping with coronary artery disease. There are also mild peripheral atherosclerotic vascular calcifications of thoracic aorta and its major branches. Mediastinum/Nodes: Visualized thyroid  gland appears grossly unremarkable. No solid / cystic mediastinal masses. The esophagus is nondistended precluding optimal assessment. There are multiple enlarged thoracic lymph nodes, predominantly in the lower neck, right hilum and bilateral axillary region. There also mildly prominent mediastinal lymph nodes. The largest lymph nodes are as follows: *Right supraclavicular level-12 x 22 mm. *Right axillary region-11 x 15 mm. *Right hilar region-14 x 18 mm. Lungs/Pleura: The central tracheo-bronchial tree is patent. There are scattered calcified granulomas throughout bilateral lungs. No suspicious lung nodule. There are minimal dependent changes in bilateral lungs. No mass or consolidation. No pleural effusion or pneumothorax. Musculoskeletal: The visualized soft tissues of the chest wall are grossly unremarkable. No suspicious osseous lesions. There are mild multilevel degenerative changes in the visualized spine. CT ABDOMEN PELVIS FINDINGS Hepatobiliary: The liver is normal in size. Non-cirrhotic configuration. No suspicious mass. There are several simple cysts in the liver with largest in the subcapsular left hepatic lobe, segment 4B measuring 1.5.8 cm. No intrahepatic or extrahepatic bile duct dilation. No  calcified gallstones. Normal gallbladder wall thickness. No pericholecystic inflammatory changes. Pancreas: Unremarkable. No pancreatic ductal dilatation or surrounding inflammatory changes. Spleen: Within normal limits. No focal lesion. Adrenals/Urinary Tract: Adrenal glands are unremarkable. No suspicious renal mass. There is a nonobstructing 5 x 9 mm calculus in the right kidney upper pole. No other nephroureterolithiasis or obstructive uropathy on either side. Urinary bladder is under distended, precluding optimal assessment. However, no large mass or stones identified. No perivesical  fat stranding. Stomach/Bowel: No disproportionate dilation of the small or large bowel loops. No evidence of abnormal bowel wall thickening or inflammatory changes. The appendix is unremarkable. There are multiple diverticula mainly in the sigmoid colon, without imaging signs of diverticulitis. Vascular/Lymphatic: No ascites or pneumoperitoneum. There are extensive enlarged lymph nodes in retrocrural space, retroperitoneum, along the mesenteric vessels, bilateral inguinal region, etc. No aneurysmal dilation of the major abdominal arteries. There are mild peripheral atherosclerotic vascular calcifications of the aorta and its major branches. Reproductive: The uterus is surgically absent. No large adnexal mass. Other: There is a tiny fat containing umbilical hernia. The soft tissues and abdominal wall are otherwise unremarkable. Musculoskeletal: No suspicious osseous lesions. There are mild multilevel degenerative changes in the visualized spine. IMPRESSION: 1. There are multiple enlarged lymph nodes in the chest, abdomen and pelvis, as described above. These are nonspecific and differential diagnosis includes lymphoproliferative disorder, metastases, etc. 2. Multiple other nonacute observations, as described above. Aortic Atherosclerosis (ICD10-I70.0). Electronically Signed   By: Beula Brunswick M.D.   On: 06/29/2023 14:48   US   LIMITED ULTRASOUND INCLUDING AXILLA RIGHT BREAST Result Date: 06/28/2023 CLINICAL DATA:  83 year old female with palpable areas concern in the bilateral upper outer breast/axilla. Recent ultrasound of bilateral inguinal regions performed 06/20/2023 demonstrated abnormally enlarged lymph nodes suspicious for malignancy. EXAM: DIGITAL DIAGNOSTIC BILATERAL MAMMOGRAM WITH TOMOSYNTHESIS AND CAD; ULTRASOUND LEFT BREAST LIMITED; ULTRASOUND RIGHT BREAST LIMITED TECHNIQUE: Bilateral digital diagnostic mammography and breast tomosynthesis was performed. The images were evaluated with computer-aided detection. ; Targeted ultrasound examination of the left breast was performed.; Targeted ultrasound examination of the right breast was performed COMPARISON:  Previous exam(s). ACR Breast Density Category b: There are scattered areas of fibroglandular density. FINDINGS: No suspicious masses or calcifications are seen in either breast. Spot compression tomosynthesis images were performed at the sites of concern in the far upper outer breast/axilla with no definite abnormality seen. Targeted ultrasound of the outer breast to axilla was performed demonstrating several lymph nodes with borderline cortical thickness. There is a lymph node in the right breast at 8 o'clock 7 cm from nipple measuring 1.4 x 0.6 x 0.6 cm with a cortical thickness of 0.3 cm. There is a 2.1 cm lymph node in the right axilla with a cortex thickness of 0.4 cm. Targeted ultrasound of the outer left breast and axilla was performed demonstrating similar findings with several lymph nodes demonstrating cortical thickening. A 1.2 cm lymph node in the outer left breast at 3 o'clock 9 cm from nipple demonstrates borderline cortical thickening of 0.3 cm. IMPRESSION: 1. Bilateral lymph nodes in the axilla/outer breasts demonstrating borderline cortical thickening. 2.  No mammographic evidence of malignancy in either breast. RECOMMENDATION: 1. Clinical follow-up for  bilateral axillary lymph nodes with borderline cortical thickening. Patient currently with recent ultrasound of inguinal regions demonstrating similar findings and the patient states she is scheduled to have a chest abdomen and pelvis CT scan tomorrow and follow-up at [facility]. Ultrasound-guided core biopsy of an abnormal axillary or inguinal lymph node could be performed if indicated. 2.  Screening mammogram in one year.(Code:SM-B-01Y) I have discussed the findings and recommendations with the patient. If applicable, a reminder letter will be sent to the patient regarding the next appointment. BI-RADS CATEGORY  1: Negative. Electronically Signed   By: Alger Infield M.D.   On: 06/28/2023 11:22   MM 3D DIAGNOSTIC MAMMOGRAM BILATERAL BREAST Result Date: 06/28/2023 CLINICAL DATA:  83 year old female with palpable areas concern in the  bilateral upper outer breast/axilla. Recent ultrasound of bilateral inguinal regions performed 06/20/2023 demonstrated abnormally enlarged lymph nodes suspicious for malignancy. EXAM: DIGITAL DIAGNOSTIC BILATERAL MAMMOGRAM WITH TOMOSYNTHESIS AND CAD; ULTRASOUND LEFT BREAST LIMITED; ULTRASOUND RIGHT BREAST LIMITED TECHNIQUE: Bilateral digital diagnostic mammography and breast tomosynthesis was performed. The images were evaluated with computer-aided detection. ; Targeted ultrasound examination of the left breast was performed.; Targeted ultrasound examination of the right breast was performed COMPARISON:  Previous exam(s). ACR Breast Density Category b: There are scattered areas of fibroglandular density. FINDINGS: No suspicious masses or calcifications are seen in either breast. Spot compression tomosynthesis images were performed at the sites of concern in the far upper outer breast/axilla with no definite abnormality seen. Targeted ultrasound of the outer breast to axilla was performed demonstrating several lymph nodes with borderline cortical thickness. There is a  lymph node in the right breast at 8 o'clock 7 cm from nipple measuring 1.4 x 0.6 x 0.6 cm with a cortical thickness of 0.3 cm. There is a 2.1 cm lymph node in the right axilla with a cortex thickness of 0.4 cm. Targeted ultrasound of the outer left breast and axilla was performed demonstrating similar findings with several lymph nodes demonstrating cortical thickening. A 1.2 cm lymph node in the outer left breast at 3 o'clock 9 cm from nipple demonstrates borderline cortical thickening of 0.3 cm. IMPRESSION: 1. Bilateral lymph nodes in the axilla/outer breasts demonstrating borderline cortical thickening. 2.  No mammographic evidence of malignancy in either breast. RECOMMENDATION: 1. Clinical follow-up for bilateral axillary lymph nodes with borderline cortical thickening. Patient currently with recent ultrasound of inguinal regions demonstrating similar findings and the patient states she is scheduled to have a chest abdomen and pelvis CT scan tomorrow and follow-up at [facility]. Ultrasound-guided core biopsy of an abnormal axillary or inguinal lymph node could be performed if indicated. 2.  Screening mammogram in one year.(Code:[id number]) I have discussed the findings and recommendations with the patient. If applicable, a reminder letter will be sent to the patient regarding the next appointment. BI-RADS CATEGORY  1: Negative. Electronically Signed   By: Alger Infield M.D.   On: 06/28/2023 11:22   US  LIMITED ULTRASOUND INCLUDING AXILLA LEFT BREAST  Result Date: 06/28/2023 CLINICAL DATA:  83 year old female with palpable areas concern in the bilateral upper outer breast/axilla. Recent ultrasound of bilateral inguinal regions performed 06/20/2023 demonstrated abnormally enlarged lymph nodes suspicious for malignancy. EXAM: DIGITAL DIAGNOSTIC BILATERAL MAMMOGRAM WITH TOMOSYNTHESIS AND CAD; ULTRASOUND LEFT BREAST LIMITED; ULTRASOUND RIGHT BREAST LIMITED TECHNIQUE: Bilateral digital diagnostic  mammography and breast tomosynthesis was performed. The images were evaluated with computer-aided detection. ; Targeted ultrasound examination of the left breast was performed.; Targeted ultrasound examination of the right breast was performed COMPARISON:  Previous exam(s). ACR Breast Density Category b: There are scattered areas of fibroglandular density. FINDINGS: No suspicious masses or calcifications are seen in either breast. Spot compression tomosynthesis images were performed at the sites of concern in the far upper outer breast/axilla with no definite abnormality seen. Targeted ultrasound of the outer breast to axilla was performed demonstrating several lymph nodes with borderline cortical thickness. There is a lymph node in the right breast at 8 o'clock 7 cm from nipple measuring 1.4 x 0.6 x 0.6 cm with a cortical thickness of 0.3 cm. There is a 2.1 cm lymph node in the right axilla with a cortex thickness of 0.4 cm. Targeted ultrasound of the outer left breast and axilla was performed demonstrating similar findings with  several lymph nodes demonstrating cortical thickening. A 1.2 cm lymph node in the outer left breast at 3 o'clock 9 cm from nipple demonstrates borderline cortical thickening of 0.3 cm. IMPRESSION: 1. Bilateral lymph nodes in the axilla/outer breasts demonstrating borderline cortical thickening. 2.  No mammographic evidence of malignancy in either breast. RECOMMENDATION: 1. Clinical follow-up for bilateral axillary lymph nodes with borderline cortical thickening. Patient currently with recent ultrasound of inguinal regions demonstrating similar findings and the patient states she is scheduled to have a chest abdomen and pelvis CT scan tomorrow and follow-up at [facility]. Ultrasound-guided core biopsy of an abnormal axillary or inguinal lymph node could be performed if indicated. 2.  Screening mammogram in one year.(Code:[id number]) I have discussed the findings and  recommendations with the patient. If applicable, a reminder letter will be sent to the patient regarding the next appointment. BI-RADS CATEGORY  1: Negative. Electronically Signed   By: Alger Infield M.D.   On: 06/28/2023 11:22    ASSESSMENT & PLAN SHONICA CRAVER 83 y.o. female with medical history significant for stage III follicular lymphoma who presents for a follow up visit.   Today we discussed the diagnosis of follicular lymphoma and steps moving forward.  We discussed that this tends to be a more indolent lymphoma and does not immediately require treatment unless there are issues with cytopenias, bulky disease, or symptoms.  We discussed criteria for treatment and the risk of progression.  The patient voiced understanding of our findings and plan moving forward.  GELF Criteria: 0. No indication for treatment  FLIPI Score: 3. High risk.   # Follicular Lymphoma Stage III --Findings at this time are consistent with a low-grade follicular lymphoma, stage III based on imaging. --Baseline labs show no evidence of anemia or other organ dysfunction. --Discussed treatment options moving forward.  Discussed we recommend observation at this time and that the treatment of choice in the event of progression or worsening disease would be monotherapy rituximab. --Patient voiced understanding of our findings and plan moving forward. --RTC in 3 months for labs and 6 months with CT imaging.    Orders Placed This Encounter  Procedures   CT CHEST ABDOMEN PELVIS W CONTRAST    Standing Status:   Future    Expected Date:   01/07/2024    Expiration Date:   07/16/2024    If indicated for the ordered procedure, I authorize the administration of contrast media per Radiology protocol:   Yes    Does the patient have a contrast media/X-ray dye allergy?:   No    Preferred imaging location?:   [facility]    If indicated for the ordered procedure, I authorize the administration of oral contrast media  per Radiology protocol:   Yes    All questions were answered. The patient knows to call the clinic with any problems, questions or concerns.  A total of more than 30 minutes were spent on this encounter with face-to-face time and non-face-to-face time, including preparing to see the patient, ordering tests and/or medications, counseling the patient and coordination of care as outlined above.   Rogerio Clay, MD Department of Hematology/Oncology [facility] Phone: [phone] Pager: [phone] Email: [email or web address]  07/17/2023 11:38 AM

## 2023-07-18 DIAGNOSIS — C44722 Squamous cell carcinoma of skin of right lower limb, including hip: Secondary | ICD-10-CM | POA: Diagnosis not present

## 2023-07-24 DIAGNOSIS — C44722 Squamous cell carcinoma of skin of right lower limb, including hip: Secondary | ICD-10-CM | POA: Diagnosis not present

## 2023-07-24 DIAGNOSIS — C44629 Squamous cell carcinoma of skin of left upper limb, including shoulder: Secondary | ICD-10-CM | POA: Diagnosis not present

## 2023-08-07 DIAGNOSIS — C44319 Basal cell carcinoma of skin of other parts of face: Secondary | ICD-10-CM | POA: Diagnosis not present

## 2023-08-09 ENCOUNTER — Telehealth: Payer: Self-pay | Admitting: *Deleted

## 2023-08-09 NOTE — Telephone Encounter (Signed)
 Carolyn Massey's daughter Carolyn Massey called [phone]) requesting information about who to speak with regarding forms.  "I have to call [employer] to receive form for her policy.  I just need her pathology report.  What else do I need to do.  Reviewed forms policy, [facility] authorization to be signed by patient, Health Care POA or legal guardian, location of form staff offices.  Awaiting form.  Carolyn states she will contact [employer] to determine what else they need.  Denies further concerns, questions or needs.Aaron Aas

## 2023-10-01 ENCOUNTER — Other Ambulatory Visit: Payer: Self-pay | Admitting: Hematology and Oncology

## 2023-10-01 ENCOUNTER — Inpatient Hospital Stay: Attending: Physician Assistant

## 2023-10-01 DIAGNOSIS — C8258 Diffuse follicle center lymphoma, lymph nodes of multiple sites: Secondary | ICD-10-CM

## 2023-10-01 LAB — CBC WITH DIFFERENTIAL (CANCER CENTER ONLY)
Abs Immature Granulocytes: 0.01 K/uL (ref 0.00–0.07)
Basophils Absolute: 0 K/uL (ref 0.0–0.1)
Basophils Relative: 1 %
Eosinophils Absolute: 0.1 K/uL (ref 0.0–0.5)
Eosinophils Relative: 1 %
HCT: 33.6 % — ABNORMAL LOW (ref 36.0–46.0)
Hemoglobin: 11 g/dL — ABNORMAL LOW (ref 12.0–15.0)
Immature Granulocytes: 0 %
Lymphocytes Relative: 30 %
Lymphs Abs: 1.3 K/uL (ref 0.7–4.0)
MCH: 27.5 pg (ref 26.0–34.0)
MCHC: 32.7 g/dL (ref 30.0–36.0)
MCV: 84 fL (ref 80.0–100.0)
Monocytes Absolute: 0.5 K/uL (ref 0.1–1.0)
Monocytes Relative: 10 %
Neutro Abs: 2.5 K/uL (ref 1.7–7.7)
Neutrophils Relative %: 58 %
Platelet Count: 166 K/uL (ref 150–400)
RBC: 4 MIL/uL (ref 3.87–5.11)
RDW: 13 % (ref 11.5–15.5)
WBC Count: 4.4 K/uL (ref 4.0–10.5)
nRBC: 0 % (ref 0.0–0.2)

## 2023-10-01 LAB — CMP (CANCER CENTER ONLY)
ALT: 19 U/L (ref 0–44)
AST: 31 U/L (ref 15–41)
Albumin: 4.1 g/dL (ref 3.5–5.0)
Alkaline Phosphatase: 70 U/L (ref 38–126)
Anion gap: 4 — ABNORMAL LOW (ref 5–15)
BUN: 13 mg/dL (ref 8–23)
CO2: 27 mmol/L (ref 22–32)
Calcium: 9.5 mg/dL (ref 8.9–10.3)
Chloride: 105 mmol/L (ref 98–111)
Creatinine: 0.99 mg/dL (ref 0.44–1.00)
GFR, Estimated: 57 mL/min — ABNORMAL LOW (ref >60)
Glucose, Bld: 88 mg/dL (ref 70–99)
Potassium: 5.3 mmol/L — ABNORMAL HIGH (ref 3.5–5.1)
Sodium: 136 mmol/L (ref 135–145)
Total Bilirubin: 0.5 mg/dL (ref 0.0–1.2)
Total Protein: 6.4 g/dL — ABNORMAL LOW (ref 6.5–8.1)

## 2023-10-01 LAB — LACTATE DEHYDROGENASE: LDH: 188 U/L (ref 98–192)

## 2023-10-08 ENCOUNTER — Encounter: Payer: Self-pay | Admitting: Medical Oncology

## 2023-10-08 NOTE — Progress Notes (Signed)
 [facility]  Patient's daughter, Mrs. Slater Halsted, called inquiring of patient's lab results collected on the 14th of July. Mrs. Tuck was provided with lab results. Mrs. Tuck also wanted to inform Dr. Federico that patient has some additional nodules on scalp and behind ears. Dr. Federico informed.  Mrs. Tuck informed should these nodules worsen or become tender to please call and we will schedule her for office visit, otherwise we will see her on October 13th 2025 for her scheduled appointment.  Daughter gave verbal understanding and denied questions.   Colene KYM Raider, RN, BSN, [facility] Oncology Nurse Navigator, [facility] 10/08/2023 4:36 PM

## 2023-10-17 DIAGNOSIS — Z01419 Encounter for gynecological examination (general) (routine) without abnormal findings: Secondary | ICD-10-CM | POA: Diagnosis not present

## 2023-10-30 DIAGNOSIS — C44629 Squamous cell carcinoma of skin of left upper limb, including shoulder: Secondary | ICD-10-CM | POA: Diagnosis not present

## 2023-10-30 DIAGNOSIS — D485 Neoplasm of uncertain behavior of skin: Secondary | ICD-10-CM | POA: Diagnosis not present

## 2023-10-30 DIAGNOSIS — L570 Actinic keratosis: Secondary | ICD-10-CM | POA: Diagnosis not present

## 2023-12-03 DIAGNOSIS — C44629 Squamous cell carcinoma of skin of left upper limb, including shoulder: Secondary | ICD-10-CM | POA: Diagnosis not present

## 2023-12-24 ENCOUNTER — Ambulatory Visit (HOSPITAL_COMMUNITY)
Admission: RE | Admit: 2023-12-24 | Discharge: 2023-12-24 | Disposition: A | Discharge status: Home / Self Care | Source: Ambulatory Visit | Attending: Hematology and Oncology | Admitting: Hematology and Oncology

## 2023-12-24 DIAGNOSIS — I2510 Atherosclerotic heart disease of native coronary artery without angina pectoris: Secondary | ICD-10-CM | POA: Diagnosis not present

## 2023-12-24 DIAGNOSIS — C8258 Diffuse follicle center lymphoma, lymph nodes of multiple sites: Secondary | ICD-10-CM | POA: Insufficient documentation

## 2023-12-24 DIAGNOSIS — C8290 Follicular lymphoma, unspecified, unspecified site: Secondary | ICD-10-CM | POA: Diagnosis not present

## 2023-12-24 DIAGNOSIS — N200 Calculus of kidney: Secondary | ICD-10-CM | POA: Diagnosis not present

## 2023-12-24 DIAGNOSIS — R590 Localized enlarged lymph nodes: Secondary | ICD-10-CM | POA: Diagnosis not present

## 2023-12-24 MED ORDER — IOHEXOL 300 MG/ML  SOLN
100.0000 mL | Freq: Once | INTRAMUSCULAR | Status: AC | PRN
  Administered 2023-12-24: 100 mL via INTRAVENOUS

## 2023-12-24 MED ORDER — IOHEXOL 9 MG/ML PO SOLN
500.0000 mL | ORAL | Status: AC
  Administered 2023-12-24 (×2): 500 mL via ORAL

## 2023-12-24 MED ORDER — SODIUM CHLORIDE (PF) 0.9 % IJ SOLN
INTRAMUSCULAR | Status: AC
  Filled 2023-12-24: qty 50

## 2023-12-27 DIAGNOSIS — Z86006 Personal history of melanoma in-situ: Secondary | ICD-10-CM | POA: Diagnosis not present

## 2023-12-27 DIAGNOSIS — Z7189 Other specified counseling: Secondary | ICD-10-CM | POA: Diagnosis not present

## 2023-12-27 DIAGNOSIS — C44712 Basal cell carcinoma of skin of right lower limb, including hip: Secondary | ICD-10-CM | POA: Diagnosis not present

## 2023-12-27 DIAGNOSIS — L570 Actinic keratosis: Secondary | ICD-10-CM | POA: Diagnosis not present

## 2023-12-27 DIAGNOSIS — D1801 Hemangioma of skin and subcutaneous tissue: Secondary | ICD-10-CM | POA: Diagnosis not present

## 2023-12-27 DIAGNOSIS — L814 Other melanin hyperpigmentation: Secondary | ICD-10-CM | POA: Diagnosis not present

## 2023-12-27 DIAGNOSIS — D485 Neoplasm of uncertain behavior of skin: Secondary | ICD-10-CM | POA: Diagnosis not present

## 2023-12-27 DIAGNOSIS — L821 Other seborrheic keratosis: Secondary | ICD-10-CM | POA: Diagnosis not present

## 2023-12-27 DIAGNOSIS — D0471 Carcinoma in situ of skin of right lower limb, including hip: Secondary | ICD-10-CM | POA: Diagnosis not present

## 2023-12-27 DIAGNOSIS — Z85828 Personal history of other malignant neoplasm of skin: Secondary | ICD-10-CM | POA: Diagnosis not present

## 2023-12-27 DIAGNOSIS — Z08 Encounter for follow-up examination after completed treatment for malignant neoplasm: Secondary | ICD-10-CM | POA: Diagnosis not present

## 2023-12-30 ENCOUNTER — Other Ambulatory Visit: Payer: Self-pay | Admitting: Hematology and Oncology

## 2023-12-30 DIAGNOSIS — C8258 Diffuse follicle center lymphoma, lymph nodes of multiple sites: Secondary | ICD-10-CM

## 2023-12-30 NOTE — Progress Notes (Unsigned)
 [facility] [phone]   [phone]  PROGRESS NOTE  Patient Care Team: Marvine Rush, MD as PCP - General (Family Medicine) Livingston Rigg, MD as Consulting Physician (Dermatology) Golden Forestine BROCKS, RN as Oncology Nurse Navigator (Medical Oncology)  Hematological/Oncological History # Follicular Lymphoma Stage III 06/27/2023: establish with [facility] clinic due to inguinal lymphadenopathy.  06/29/2023: CT C/A/P showed multiple enlarged lymph nodes in the chest, abdomen and pelvis.  Findings showed a right supraclavicular node measuring 12 x 22 mm, right axillary node 11 x 15 mm and hilar region 14 x 18 mm. 07/05/2023: needle biopsy of right inguinal lymph nodes shows low grade follicular lymphoma   Interval History:  Carolyn Massey 83 y.o. female with medical history significant for stage III follicular lymphoma who presents for a follow up visit. The patient's last visit was on 07/17/2023. In the interim since the last visit she underwent a CT scan which showed a conglomeration of lymph nodes measuring greater than 10 cm.  On exam today Carolyn Massey reports that she has been well overall in interim since her last visit 3 months ago.  She has had no major changes in her health.  Her energy and appetite are both strong and her weight has been stable at 117 pounds.  She reports that she is not having any issues with lightheadedness, dizziness, or shortness of breath.  She denies any headache or vision changes.  She notes that she does not have any bumps or lumps concerning for lymphadenopathy in her neck, underarms, or groin.  Overall her health has been steady.  She otherwise has been at her baseline level of health.  The bulk of her discussion focused on the results of her CT scan.  She does appear to have increasing lymphadenopathy with bulky lymphadenopathy, having several lymph nodes greater than 3 cm and a conglomeration of lymph nodes measuring greater than 10  cm.  Due to bulky disease the patient meets criteria for treatment.  We discussed rituximab monotherapy and she voiced understanding and was willing and able to proceed with this treatment.  MEDICAL HISTORY:  Past Medical History:  Diagnosis Date   Anemia    Arthritis    shoulder   Basal cell carcinoma 02/15/1994   right shoulder(CX35FU), post neck (CX35FU)   Basal cell carcinoma 01/17/2000   upper Left lip (CX35FU)   Basal cell carcinoma 02/12/2001   superificial-mid chest (CX35FU), behind right shoulder (CX35FU)   Basal cell carcinoma 06/21/2001   ulcerated- Left bulb of nose (CX35FU)   Basal cell carcinoma 10/28/2002   sclerosis- over Right brow-(MOHS), sclerosis- Left tip nose (MOHS)   Basal cell carcinoma 06/30/2003   superificial-RIght inner mid calf (CX35FU)   Basal cell carcinoma 08/15/2005   superificial-Right chest (CX35FU)   Basal cell carcinoma 09/22/2009   superificial-mid spinal (CX35FU), Right nose-(CX35FU)   Basal cell carcinoma 06/21/2011   superificial- Right upper chest (SRT)   Basal cell carcinoma 02/05/2015   above Left upper lip inferior (MOHS), alove Left upper lip superior (MOHS)   Basal cell carcinoma 06/06/2016   sup&nod-Left forehead-(CX35FU), nod-RIght mid back-(CX35FU)   Basal cell carcinoma 04/23/2019   superifcial-RIght scapula, sclerosis-Right chest superior   Nodular basal cell carcinoma (BCC) 06/15/2020   Left Forearm anterior   SCCA (squamous cell carcinoma) of skin 06/15/2020   Dorsum of Nose (in situ)   Squamous cell carcinoma of skin 09/22/2009   in situ-Right collarbone(CX35FU),  insitu-mid chest (CX35FU), insitu-Right tip of nose (CX35FU)  Squamous cell carcinoma of skin 10/09/2017   in situ-Right post shoulder-(CX35FU), in situ-Left cheek (CX35FU)   Squamous cell carcinoma of skin 04/23/2019   insitu-Left upper arm, in situ-  below Right  knee outer    SURGICAL HISTORY: Past Surgical History:  Procedure Laterality Date   EYE  SURGERY     to remove cataracts   MOHS SURGERY     x several over the years   SENTINEL NODE BIOPSY Right 07/05/2023   Procedure: BIOPSY, LYMPH NODE, SENTINEL;  Surgeon: Dasie Leonor CROME, MD;  Location: [facility] OR;  Service: General;  Laterality: Right;   TOTAL ABDOMINAL HYSTERECTOMY      SOCIAL HISTORY: Social History   Socioeconomic History   Marital status: Married    Spouse name: Not on file   Number of children: Not on file   Years of education: Not on file   Highest education level: Not on file  Occupational History   Not on file  Tobacco Use   Smoking status: Never   Smokeless tobacco: Never  Vaping Use   Vaping status: Never Used  Substance and Sexual Activity   Alcohol use: Never   Drug use: Never   Sexual activity: Not Currently    Birth control/protection: Surgical    Comment: Hysterectomy  Other Topics Concern   Not on file  Social History Narrative   Not on file   Social Drivers of Health   Financial Resource Strain: Not on file  Food Insecurity: Not on file  Transportation Needs: Not on file  Physical Activity: Not on file  Stress: Not on file  Social Connections: Not on file  Intimate Partner Violence: Not on file    FAMILY HISTORY: No family history on file.  ALLERGIES:  has no known allergies.  MEDICATIONS:  No current outpatient medications on file.   No current facility-administered medications for this visit.    REVIEW OF SYSTEMS:   Constitutional: ( - ) fevers, ( - )  chills , ( - ) night sweats Eyes: ( - ) blurriness of vision, ( - ) double vision, ( - ) watery eyes Ears, nose, mouth, throat, and face: ( - ) mucositis, ( - ) sore throat Respiratory: ( - ) cough, ( - ) dyspnea, ( - ) wheezes Cardiovascular: ( - ) palpitation, ( - ) chest discomfort, ( - ) lower extremity swelling Gastrointestinal:  ( - ) nausea, ( - ) heartburn, ( - ) change in bowel habits Skin: ( - ) abnormal skin rashes Lymphatics: ( - ) new lymphadenopathy, ( - ) easy  bruising Neurological: ( - ) numbness, ( - ) tingling, ( - ) new weaknesses Behavioral/Psych: ( - ) mood change, ( - ) new changes  All other systems were reviewed with the patient and are negative.  PHYSICAL EXAMINATION:  Vitals:   12/31/23 1012  BP: (!) 142/64  Pulse: 67  Resp: 13  Temp: (!) 97.3 F (36.3 C)  SpO2: 100%    Filed Weights   12/31/23 1012  Weight: 117 lb 6.4 oz (53.3 kg)     GENERAL: Well-appearing elderly Caucasian female, alert, no distress and comfortable SKIN: skin color, texture, turgor are normal, no rashes or significant lesions EYES: conjunctiva are pink and non-injected, sclera clear LUNGS: clear to auscultation and percussion with normal breathing effort HEART: regular rate & rhythm and no murmurs and no lower extremity edema GROIN: Well-healing incision site where right inguinal lymph node was removed. Musculoskeletal: no cyanosis of  digits and no clubbing  PSYCH: alert & oriented x 3, fluent speech NEURO: no focal motor/sensory deficits  LABORATORY DATA:  I have reviewed the data as listed    Latest Ref Rng & Units 12/31/2023    9:36 AM 10/01/2023    9:52 AM 06/27/2023    1:56 PM  CBC  WBC 4.0 - 10.5 K/uL 5.4  4.4  4.7   Hemoglobin 12.0 - 15.0 g/dL 89.1  88.9  88.5   Hematocrit 36.0 - 46.0 % 33.0  33.6  34.4   Platelets 150 - 400 K/uL 162  166  163        Latest Ref Rng & Units 12/31/2023    9:36 AM 10/01/2023    9:52 AM 06/27/2023    1:56 PM  CMP  Glucose 70 - 99 mg/dL 94  88  90   BUN 8 - 23 mg/dL 20  13  13    Creatinine 0.44 - 1.00 mg/dL 9.00  9.00  9.00   Sodium 135 - 145 mmol/L 142  136  138   Potassium 3.5 - 5.1 mmol/L 4.7  5.3  4.5   Chloride 98 - 111 mmol/L 110  105  105   CO2 22 - 32 mmol/L 29  27  27    Calcium 8.9 - 10.3 mg/dL 9.7  9.5  9.4   Total Protein 6.5 - 8.1 g/dL 6.2  6.4  6.9   Total Bilirubin 0.0 - 1.2 mg/dL 0.4  0.5  0.5   Alkaline Phos 38 - 126 U/L 59  70  66   AST 15 - 41 U/L 27  31  30    ALT 0 - 44 U/L 13   19  22      No results found for: MPROTEIN No results found for: KPAFRELGTCHN, LAMBDASER, KAPLAMBRATIO  RADIOGRAPHIC STUDIES: I have personally reviewed the radiological images as listed and agreed with the findings in the report. CT CHEST ABDOMEN PELVIS W CONTRAST Result Date: 12/27/2023 CLINICAL DATA:  Hematologic malignancy, diffuse follicular lymphoma * Tracking Code: BO * EXAM: CT CHEST, ABDOMEN, AND PELVIS WITH CONTRAST TECHNIQUE: Multidetector CT imaging of the chest, abdomen and pelvis was performed following the standard protocol during bolus administration of intravenous contrast. RADIATION DOSE REDUCTION: This exam was performed according to the departmental dose-optimization program which includes automated exposure control, adjustment of the mA and/or kV according to patient size and/or use of iterative reconstruction technique. CONTRAST:  OMNIPAQUE  IOHEXOL  300 MG/ML SOLN additional oral enteric contrast COMPARISON:  06/29/2023 FINDINGS: CT CHEST FINDINGS Cardiovascular: Aortic atherosclerosis. Cardiomegaly. Left coronary artery calcifications. No pericardial effusion. Mediastinum/Nodes: Numerous enlarged lymph nodes throughout the chest, some of which are slightly diminished in size compared to prior examination. Example right subpectoral lymph node measures 1.7 x 0.9 cm, previously 2.1 x 1.1 cm (series 2, image 12). There has however been slight enlargement of a right hilar lymph node, on today's examination measuring 2.2 x 1.6 cm, previously 1.9 x 1.4 cm when measured similarly (series 2, image 24). There is likewise been slight enlargement of a low right periaortic node measuring 3.2 x 2.2 cm, previously 2.4 x 1.9 cm (series 2, image 44). Thyroid  gland, trachea, and esophagus demonstrate no significant findings. Lungs/Pleura: Lungs are clear. No pleural effusion or pneumothorax. Musculoskeletal: No chest wall abnormality. No acute osseous findings. CT ABDOMEN PELVIS FINDINGS  Hepatobiliary: No solid liver abnormality is seen. Multiple benign liver cysts requiring no further follow-up or characterization. No gallstones, gallbladder wall thickening, or biliary  dilatation. Pancreas: Unremarkable. No pancreatic ductal dilatation or surrounding inflammatory changes. Spleen: Normal in size without significant abnormality. Adrenals/Urinary Tract: Adrenal glands are unremarkable. Large calculus in the inferior pole of the right kidney. No left-sided calculi, ureteral calculi, or hydronephrosis. Bladder is unremarkable. Stomach/Bowel: Stomach is within normal limits. Appendix not clearly visualized. No evidence of bowel wall thickening, distention, or inflammatory changes. Sigmoid diverticulosis. Vascular/Lymphatic: No significant vascular findings are present. Numerous enlarged bulky lymph nodes throughout the abdomen and pelvis again seen. Many are unchanged, index retroperitoneal node measuring 3.5 x 2.8 cm (series 2, image 62), others enlarged, particularly in the small bowel mesentery, index node in the ventral left hemiabdomen measuring 3.7 x 2.1 cm, previously 2.6 x 1.4 cm (series 2, image 70). Additionally, there is an interval increase in densely matted lymph nodes and soft tissue in the inferior aspect of the central mesentery, this largest conglomerate measuring 10.4 x 4.4 cm, previously 8.8 x 3.6 cm when measured similarly (series 2, image 84). Reproductive: Hysterectomy. Other: No abdominal wall hernia or abnormality. No ascites. Musculoskeletal: No acute osseous findings. IMPRESSION: 1. Numerous enlarged lymph nodes throughout the chest, abdomen, and pelvis, some of which are slightly diminished in size compared to prior examination. There has however been slight enlargement of some lymph nodes, including in the right hilum and low right periaortic nodes. 2. Numerous bulky lymph nodes throughout the abdomen and pelvis, many of which are unchanged, some of which are slightly  enlarged, most notably in the small bowel mesentery, including a large, matted lymph node and soft tissue conglomerate. 3. Constellation of findings is consistent with mixed response to treatment, however on the balance with evidence of some disease progression with enlarging lymph nodes in both the chest and abdomen. 4. Cardiomegaly and coronary artery disease. 5. Nonobstructing right nephrolithiasis. Aortic Atherosclerosis (ICD10-I70.0). Electronically Signed   By: Marolyn JONETTA Jaksch M.D.   On: 12/27/2023 09:17    ASSESSMENT & PLAN Carolyn Massey 83 y.o. female with medical history significant for stage III follicular lymphoma who presents for a follow up visit.   Today we discussed the diagnosis of follicular lymphoma and steps moving forward.  We discussed that this tends to be a more indolent lymphoma and does not immediately require treatment unless there are issues with cytopenias, bulky disease, or symptoms.  We discussed criteria for treatment and the risk of progression.  The patient voiced understanding of our findings and plan moving forward.  GELF Criteria: 1. Indication for treatment based on conglomerated mesenteric lymph nodes.  FLIPI Score: 3. High risk.   # Follicular Lymphoma Stage III --Findings at this time are consistent with a low-grade follicular lymphoma, stage III based on imaging. --labs today show white blood cell 5.4, hemoglobin 10.8, MCV 85.3, platelets 162.  LFTs and creatinine within normal limits. --Baseline labs show no evidence of severe cytopenias or other organ dysfunction. -- CT scan performed on 12/24/2023 showed clear evidence of bulky lymphadenopathy with enlarged lymph nodes, several greater than 3 cm and 1 large conglomeration greater than 10 cm.  Due to this the patient meets criteria for treatment. --Will plan to proceed with rituximab monotherapy x 4 weeks, followed by maintenance rituximab every 2 months x 2 years. --Will plan for repeat CT scan in  approximately 2 to 3 months time. --Patient voiced understanding of our findings and plan moving forward. --RTC for the start of rituximab therapy.   Orders Placed This Encounter  Procedures   CBC with Differential [facility]  Only)    Standing Status:   Standing    Number of Occurrences:   4    Expiration Date:   12/30/2024   CMP (Cancer Center only)    Standing Status:   Standing    Number of Occurrences:   4    Expiration Date:   12/30/2024   Lactate dehydrogenase (LDH)    Standing Status:   Standing    Number of Occurrences:   4    Expiration Date:   12/30/2024   Uric acid    Standing Status:   Future    Expiration Date:   12/30/2024    All questions were answered. The patient knows to call the clinic with any problems, questions or concerns.  A total of more than 30 minutes were spent on this encounter with face-to-face time and non-face-to-face time, including preparing to see the patient, ordering tests and/or medications, counseling the patient and coordination of care as outlined above.   Norleen IVAR Kidney, MD Department of Hematology/Oncology [facility] Phone: [phone] Pager: [phone] Email: [email or web address]  12/31/2023 4:32 PM

## 2023-12-31 ENCOUNTER — Inpatient Hospital Stay: Admitting: Hematology and Oncology

## 2023-12-31 ENCOUNTER — Inpatient Hospital Stay: Attending: Physician Assistant

## 2023-12-31 VITALS — BP 142/64 | HR 67 | Temp 97.3°F | Resp 13 | Wt 117.4 lb

## 2023-12-31 DIAGNOSIS — Z5112 Encounter for antineoplastic immunotherapy: Secondary | ICD-10-CM | POA: Insufficient documentation

## 2023-12-31 DIAGNOSIS — Z85828 Personal history of other malignant neoplasm of skin: Secondary | ICD-10-CM | POA: Insufficient documentation

## 2023-12-31 DIAGNOSIS — I2510 Atherosclerotic heart disease of native coronary artery without angina pectoris: Secondary | ICD-10-CM | POA: Diagnosis not present

## 2023-12-31 DIAGNOSIS — K7689 Other specified diseases of liver: Secondary | ICD-10-CM | POA: Diagnosis not present

## 2023-12-31 DIAGNOSIS — N200 Calculus of kidney: Secondary | ICD-10-CM | POA: Insufficient documentation

## 2023-12-31 DIAGNOSIS — I700 Atherosclerosis of aorta: Secondary | ICD-10-CM | POA: Insufficient documentation

## 2023-12-31 DIAGNOSIS — I517 Cardiomegaly: Secondary | ICD-10-CM | POA: Insufficient documentation

## 2023-12-31 DIAGNOSIS — M129 Arthropathy, unspecified: Secondary | ICD-10-CM | POA: Diagnosis not present

## 2023-12-31 DIAGNOSIS — R591 Generalized enlarged lymph nodes: Secondary | ICD-10-CM | POA: Diagnosis not present

## 2023-12-31 DIAGNOSIS — C8258 Diffuse follicle center lymphoma, lymph nodes of multiple sites: Secondary | ICD-10-CM | POA: Insufficient documentation

## 2023-12-31 DIAGNOSIS — K5730 Diverticulosis of large intestine without perforation or abscess without bleeding: Secondary | ICD-10-CM | POA: Diagnosis not present

## 2023-12-31 LAB — CBC WITH DIFFERENTIAL (CANCER CENTER ONLY)
Abs Immature Granulocytes: 0.01 K/uL (ref 0.00–0.07)
Basophils Absolute: 0 K/uL (ref 0.0–0.1)
Basophils Relative: 1 %
Eosinophils Absolute: 0.1 K/uL (ref 0.0–0.5)
Eosinophils Relative: 2 %
HCT: 33 % — ABNORMAL LOW (ref 36.0–46.0)
Hemoglobin: 10.8 g/dL — ABNORMAL LOW (ref 12.0–15.0)
Immature Granulocytes: 0 %
Lymphocytes Relative: 31 %
Lymphs Abs: 1.7 K/uL (ref 0.7–4.0)
MCH: 27.9 pg (ref 26.0–34.0)
MCHC: 32.7 g/dL (ref 30.0–36.0)
MCV: 85.3 fL (ref 80.0–100.0)
Monocytes Absolute: 0.6 K/uL (ref 0.1–1.0)
Monocytes Relative: 11 %
Neutro Abs: 3 K/uL (ref 1.7–7.7)
Neutrophils Relative %: 55 %
Platelet Count: 162 K/uL (ref 150–400)
RBC: 3.87 MIL/uL (ref 3.87–5.11)
RDW: 13.7 % (ref 11.5–15.5)
WBC Count: 5.4 K/uL (ref 4.0–10.5)
nRBC: 0 % (ref 0.0–0.2)

## 2023-12-31 LAB — CMP (CANCER CENTER ONLY)
ALT: 13 U/L (ref 0–44)
AST: 27 U/L (ref 15–41)
Albumin: 4.1 g/dL (ref 3.5–5.0)
Alkaline Phosphatase: 59 U/L (ref 38–126)
Anion gap: 3 — ABNORMAL LOW (ref 5–15)
BUN: 20 mg/dL (ref 8–23)
CO2: 29 mmol/L (ref 22–32)
Calcium: 9.7 mg/dL (ref 8.9–10.3)
Chloride: 110 mmol/L (ref 98–111)
Creatinine: 0.99 mg/dL (ref 0.44–1.00)
GFR, Estimated: 57 mL/min — ABNORMAL LOW (ref >60)
Glucose, Bld: 94 mg/dL (ref 70–99)
Potassium: 4.7 mmol/L (ref 3.5–5.1)
Sodium: 142 mmol/L (ref 135–145)
Total Bilirubin: 0.4 mg/dL (ref 0.0–1.2)
Total Protein: 6.2 g/dL — ABNORMAL LOW (ref 6.5–8.1)

## 2023-12-31 LAB — LACTATE DEHYDROGENASE: LDH: 186 U/L (ref 98–192)

## 2023-12-31 NOTE — Progress Notes (Signed)
 START ON PATHWAY REGIMEN - Lymphoma and CLL     A cycle is every 7 days:     Rituximab-xxxx   **Always confirm dose/schedule in your pharmacy ordering system**  Patient Characteristics: Follicular Lymphoma, Grades 1, 2, and 3A, First Line, Stage III / IV, Symptomatic or Bulky Disease Disease Type: Follicular Lymphoma, Grade 1, 2, or 3A Disease Type: Not Applicable Disease Type: Not Applicable Line of Therapy: First Line Disease Characteristics: Symptomatic or Bulky Disease Intent of Therapy: Curative Intent, Discussed with Patient

## 2024-01-01 ENCOUNTER — Other Ambulatory Visit: Payer: Self-pay

## 2024-01-03 ENCOUNTER — Inpatient Hospital Stay

## 2024-01-03 DIAGNOSIS — D0471 Carcinoma in situ of skin of right lower limb, including hip: Secondary | ICD-10-CM | POA: Diagnosis not present

## 2024-01-03 DIAGNOSIS — C44712 Basal cell carcinoma of skin of right lower limb, including hip: Secondary | ICD-10-CM | POA: Diagnosis not present

## 2024-01-04 NOTE — Progress Notes (Signed)
 Pharmacist Chemotherapy Monitoring - Initial Assessment    Anticipated start date: 01/11/24   The following has been reviewed per standard work regarding the patient's treatment regimen: The patient's diagnosis, treatment plan and drug doses, and organ/hematologic function Lab orders and baseline tests specific to treatment regimen  The treatment plan start date, drug sequencing, and pre-medications Prior authorization status  Patient's documented medication list, including drug-drug interaction screen and prescriptions for anti-emetics and supportive care specific to the treatment regimen The drug concentrations, fluid compatibility, administration routes, and timing of the medications to be used The patient's access for treatment and lifetime cumulative dose history, if applicable  The patient's medication allergies and previous infusion related reactions, if applicable   Changes made to treatment plan:  treatment plan date  Follow up needed:  N/A   Bridgett Leotis Helling, RPH, BCPS, BCOP 01/04/2024  10:23 AM

## 2024-01-06 ENCOUNTER — Other Ambulatory Visit: Payer: Self-pay

## 2024-01-10 ENCOUNTER — Other Ambulatory Visit: Payer: Self-pay | Admitting: Hematology and Oncology

## 2024-01-10 ENCOUNTER — Encounter: Payer: Self-pay | Admitting: Hematology and Oncology

## 2024-01-11 ENCOUNTER — Inpatient Hospital Stay

## 2024-01-11 ENCOUNTER — Inpatient Hospital Stay: Admitting: Physician Assistant

## 2024-01-11 VITALS — BP 122/61 | HR 85 | Temp 96.5°F | Resp 16 | Wt 116.2 lb

## 2024-01-11 VITALS — BP 154/65 | HR 75 | Temp 97.8°F | Resp 16

## 2024-01-11 DIAGNOSIS — T50905A Adverse effect of unspecified drugs, medicaments and biological substances, initial encounter: Secondary | ICD-10-CM | POA: Diagnosis not present

## 2024-01-11 DIAGNOSIS — R519 Headache, unspecified: Secondary | ICD-10-CM | POA: Diagnosis not present

## 2024-01-11 DIAGNOSIS — C8258 Diffuse follicle center lymphoma, lymph nodes of multiple sites: Secondary | ICD-10-CM

## 2024-01-11 DIAGNOSIS — Z5112 Encounter for antineoplastic immunotherapy: Secondary | ICD-10-CM | POA: Diagnosis not present

## 2024-01-11 LAB — CMP (CANCER CENTER ONLY)
ALT: 8 U/L (ref 0–44)
AST: 25 U/L (ref 15–41)
Albumin: 4.2 g/dL (ref 3.5–5.0)
Alkaline Phosphatase: 67 U/L (ref 38–126)
Anion gap: 7 (ref 5–15)
BUN: 15 mg/dL (ref 8–23)
CO2: 25 mmol/L (ref 22–32)
Calcium: 9.4 mg/dL (ref 8.9–10.3)
Chloride: 109 mmol/L (ref 98–111)
Creatinine: 1.06 mg/dL — ABNORMAL HIGH (ref 0.44–1.00)
GFR, Estimated: 52 mL/min — ABNORMAL LOW (ref >60)
Glucose, Bld: 95 mg/dL (ref 70–99)
Potassium: 4.3 mmol/L (ref 3.5–5.1)
Sodium: 141 mmol/L (ref 135–145)
Total Bilirubin: 0.5 mg/dL (ref 0.0–1.2)
Total Protein: 6.5 g/dL (ref 6.5–8.1)

## 2024-01-11 LAB — CBC WITH DIFFERENTIAL (CANCER CENTER ONLY)
Abs Immature Granulocytes: 0.01 K/uL (ref 0.00–0.07)
Basophils Absolute: 0 K/uL (ref 0.0–0.1)
Basophils Relative: 1 %
Eosinophils Absolute: 0.1 K/uL (ref 0.0–0.5)
Eosinophils Relative: 1 %
HCT: 35 % — ABNORMAL LOW (ref 36.0–46.0)
Hemoglobin: 11.1 g/dL — ABNORMAL LOW (ref 12.0–15.0)
Immature Granulocytes: 0 %
Lymphocytes Relative: 23 %
Lymphs Abs: 1.1 K/uL (ref 0.7–4.0)
MCH: 27.3 pg (ref 26.0–34.0)
MCHC: 31.7 g/dL (ref 30.0–36.0)
MCV: 86 fL (ref 80.0–100.0)
Monocytes Absolute: 0.5 K/uL (ref 0.1–1.0)
Monocytes Relative: 10 %
Neutro Abs: 3.2 K/uL (ref 1.7–7.7)
Neutrophils Relative %: 65 %
Platelet Count: 160 K/uL (ref 150–400)
RBC: 4.07 MIL/uL (ref 3.87–5.11)
RDW: 13.5 % (ref 11.5–15.5)
WBC Count: 4.9 K/uL (ref 4.0–10.5)
nRBC: 0 % (ref 0.0–0.2)

## 2024-01-11 LAB — LACTATE DEHYDROGENASE: LDH: 200 U/L — ABNORMAL HIGH (ref 98–192)

## 2024-01-11 LAB — URIC ACID: Uric Acid, Serum: 6.3 mg/dL (ref 2.5–7.1)

## 2024-01-11 MED ORDER — SODIUM CHLORIDE 0.9 % IV SOLN
375.0000 mg/m2 | Freq: Once | INTRAVENOUS | Status: AC
  Administered 2024-01-11: 600 mg via INTRAVENOUS
  Filled 2024-01-11: qty 50

## 2024-01-11 MED ORDER — SODIUM CHLORIDE 0.9 % IV SOLN: Freq: Once | INTRAVENOUS | Status: DC | PRN

## 2024-01-11 MED ORDER — SODIUM CHLORIDE 0.9 % IV SOLN: INTRAVENOUS | Status: DC

## 2024-01-11 MED ORDER — FAMOTIDINE IN NACL 20-0.9 MG/50ML-% IV SOLN
20.0000 mg | Freq: Once | INTRAVENOUS | Status: AC | PRN
  Administered 2024-01-11: 20 mg via INTRAVENOUS

## 2024-01-11 MED ORDER — DIPHENHYDRAMINE HCL 25 MG PO CAPS
50.0000 mg | ORAL_CAPSULE | Freq: Once | ORAL | Status: AC
  Administered 2024-01-11: 50 mg via ORAL
  Filled 2024-01-11: qty 2

## 2024-01-11 MED ORDER — ACETAMINOPHEN 325 MG PO TABS
325.0000 mg | ORAL_TABLET | Freq: Once | ORAL | Status: AC
  Administered 2024-01-11: 325 mg via ORAL

## 2024-01-11 MED ORDER — ACETAMINOPHEN 325 MG PO TABS
650.0000 mg | ORAL_TABLET | Freq: Once | ORAL | Status: AC
  Administered 2024-01-11: 650 mg via ORAL
  Filled 2024-01-11: qty 2

## 2024-01-11 MED ORDER — METHYLPREDNISOLONE SODIUM SUCC 125 MG IJ SOLR
125.0000 mg | Freq: Once | INTRAMUSCULAR | Status: AC | PRN
  Administered 2024-01-11: 125 mg via INTRAVENOUS

## 2024-01-11 NOTE — Patient Instructions (Signed)
 [facility] - A DEPT OF [facility]   Discharge Instructions: Thank you for choosing [facility] to provide your oncology and hematology care.   If you have a lab appointment with the Cancer Center, please go directly to the Cancer Center and check in at the registration area.   Wear comfortable clothing and clothing appropriate for easy access to any Portacath or PICC line.   We strive to give you quality time with your provider. You may need to reschedule your appointment if you arrive late (15 or more minutes).  Arriving late affects you and other patients whose appointments are after yours.  Also, if you miss three or more appointments without notifying the office, you may be dismissed from the clinic at the provider's discretion.      For prescription refill requests, have your pharmacy contact our office and allow 72 hours for refills to be completed.    Today you received the following chemotherapy and/or immunotherapy agents: Rituximab-xxxx  (Ruxience)   To help prevent nausea and vomiting after your treatment, we encourage you to take your nausea medication as directed.  BELOW ARE SYMPTOMS THAT SHOULD BE REPORTED IMMEDIATELY: *FEVER GREATER THAN 100.4 F (38 C) OR HIGHER *CHILLS OR SWEATING *NAUSEA AND VOMITING THAT IS NOT CONTROLLED WITH YOUR NAUSEA MEDICATION *UNUSUAL SHORTNESS OF BREATH *UNUSUAL BRUISING OR BLEEDING *URINARY PROBLEMS (pain or burning when urinating, or frequent urination) *BOWEL PROBLEMS (unusual diarrhea, constipation, pain near the anus) TENDERNESS IN MOUTH AND THROAT WITH OR WITHOUT PRESENCE OF ULCERS (sore throat, sores in mouth, or a toothache) UNUSUAL RASH, SWELLING OR PAIN  UNUSUAL VAGINAL DISCHARGE OR ITCHING   Items with * indicate a potential emergency and should be followed up as soon as possible or go to the Emergency Department if any problems should occur.  Please show the CHEMOTHERAPY ALERT CARD  or IMMUNOTHERAPY ALERT CARD at check-in to the Emergency Department and triage nurse.  Should you have questions after your visit or need to cancel or reschedule your appointment, please contact [facility] - A DEPT OF [facility]  Dept: [phone]  and follow the prompts.  Office hours are 8:00 a.m. to 4:30 p.m. Monday - Friday. Please note that voicemails left after 4:00 p.m. may not be returned until the following business day.  We are closed weekends and major holidays. You have access to a nurse at all times for urgent questions. Please call the main number to the clinic Dept: [phone] and follow the prompts.   For any non-urgent questions, you may also contact your provider using MyChart. We now offer e-Visits for anyone 64 and older to request care online for non-urgent symptoms. For details visit [email or web address].   Also download the MyChart app! Go to the app store, search MyChart, open the app, select , and log in with your MyChart username and password.

## 2024-01-11 NOTE — Progress Notes (Signed)
 Hypersensitivity Reaction Note  Date of event: 01/11/24  Time of event: 1005   Type of event: Grade 2 (Moderate reaction; Requires therapy or infusion interruption, but responds promptly to interventions; prophylactic medications indicated for <24 hours)    Generic name of drug involved: Rituximab (Rituxan, Riabni, Ruxience, Truxima)  Initial Presentation and Response:  The patient reported and showed signs of flushing, headache, and hypertension during the infusion.   Provider notified of the hypersensitivity reaction: Mallie Combes, PA-C and Dr. Federico  Time of provider notification: 1008  Initial Interventions Implemented:   Infusion stopped: Yes  Medications administered - see MAR for sequence and times of administration: Normal Saline 1 L IV, Famotidine (Pepcid) 20mg  mg IV, Methylprednisolone (Solu-Medrol) 125 mg mg IV, and Other (Tylenol  325 mg PO)  Additional interventions: N/A  Patient response to treatment: Symptoms resolved following interventions.  Remaining Course of Treatment:    The patient was able to complete the infusion at the original rate.  Was agent that likely caused hypersensitivity reaction added to Allergies List within EMR? Yes   Additional Information Regarding the Chain of Events (including reaction signs/symptoms, treatment administered, and outcome):   1000: First time rituximab started at 50 mg/hr rate.  1005: Patient noted feeling unwell. Patient presented with flushing, headache, and hypertension. Infusion stopped and 1 L NS hung to gravity.  1008: Mallie Combes, PA-C notified and assessed patient at chairside. See MAR for additional medications administered and flowsheets for vital signs.  1055: Symptoms fully resolved. Received approval from Dr. Federico and Mallie Combes, PA-C to restart rituximab at half rate.  Patient able to tolerate remaining infusion without incident.

## 2024-01-11 NOTE — Progress Notes (Signed)
    DATE:  01/11/24                                        X CHEMO/IMMUNOTHERAPY REACTION           MD: Federico   AGENT/BLOOD PRODUCT RECEIVING TODAY:              Ruxience   AGENT/BLOOD PRODUCT RECEIVING IMMEDIATELY PRIOR TO REACTION:          Ruxience    Vitals:   01/11/24 1011 01/11/24 1015  BP: (!) 165/69 (!) 154/65  Pulse: 79 75  Resp: 16 16  Temp: 97.8 F (36.6 C)   TempSrc: Oral   SpO2: 100% 100%      REACTION(S):           diaphoresis, flushing, headache   PREMEDS:   Benadryl 50 mg PO, Tylenol  650 mg PO   INTERVENTION: Pepcid 20 mg IV, solu-medrol 125 mg IV, Tylenol  325 mg PO   Review of Systems  Review of Systems  Constitutional:  Positive for diaphoresis.  Skin:  Positive for color change.  All other systems reviewed and are negative.    Physical Exam  Physical Exam Vitals and nursing note reviewed.  Constitutional:      Appearance: She is not ill-appearing or toxic-appearing.  HENT:     Head: Normocephalic.  Eyes:     Conjunctiva/sclera: Conjunctivae normal.  Cardiovascular:     Rate and Rhythm: Normal rate and regular rhythm.     Pulses: Normal pulses.     Heart sounds: Normal heart sounds.  Pulmonary:     Effort: Pulmonary effort is normal.     Breath sounds: Normal breath sounds.  Abdominal:     General: There is no distension.  Musculoskeletal:     Cervical back: Normal range of motion.  Skin:    General: Skin is warm and dry.     Comments: Facial flushing  Neurological:     Mental Status: She is alert.     OUTCOME:                  Patient became symptomatic 5 minutes into first time Ruxience infusion.Emergency medications were administered as documented above. Patient returned to baseline. Oncologist notified and agrees to resume treatment. Patient tolerated remainder of treatment.Pre medications will be added to subsequent treatments.   I have spent a total of 10 minutes minutes of face-to-face and non-face-to-face time  preparing to see the patient, performing a medically appropriate examination, counseling and educating the patient, medications, documenting clinical information in the electronic health record, and care coordination.

## 2024-01-14 ENCOUNTER — Telehealth: Payer: Self-pay

## 2024-01-14 NOTE — Telephone Encounter (Signed)
 First time Rituximab 01-11-24 pt. Had reaction . See office notes from 01-11-24.

## 2024-01-17 ENCOUNTER — Inpatient Hospital Stay

## 2024-01-17 VITALS — BP 138/67 | HR 72 | Temp 97.9°F | Resp 14 | Wt 114.8 lb

## 2024-01-17 DIAGNOSIS — C8258 Diffuse follicle center lymphoma, lymph nodes of multiple sites: Secondary | ICD-10-CM

## 2024-01-17 DIAGNOSIS — Z5112 Encounter for antineoplastic immunotherapy: Secondary | ICD-10-CM | POA: Diagnosis not present

## 2024-01-17 LAB — CBC WITH DIFFERENTIAL (CANCER CENTER ONLY)
Abs Immature Granulocytes: 0.01 K/uL (ref 0.00–0.07)
Basophils Absolute: 0 K/uL (ref 0.0–0.1)
Basophils Relative: 1 %
Eosinophils Absolute: 0.1 K/uL (ref 0.0–0.5)
Eosinophils Relative: 2 %
HCT: 31.2 % — ABNORMAL LOW (ref 36.0–46.0)
Hemoglobin: 10.5 g/dL — ABNORMAL LOW (ref 12.0–15.0)
Immature Granulocytes: 0 %
Lymphocytes Relative: 29 %
Lymphs Abs: 1.1 K/uL (ref 0.7–4.0)
MCH: 27.9 pg (ref 26.0–34.0)
MCHC: 33.7 g/dL (ref 30.0–36.0)
MCV: 83 fL (ref 80.0–100.0)
Monocytes Absolute: 0.5 K/uL (ref 0.1–1.0)
Monocytes Relative: 12 %
Neutro Abs: 2.1 K/uL (ref 1.7–7.7)
Neutrophils Relative %: 56 %
Platelet Count: 163 K/uL (ref 150–400)
RBC: 3.76 MIL/uL — ABNORMAL LOW (ref 3.87–5.11)
RDW: 13.5 % (ref 11.5–15.5)
WBC Count: 3.8 K/uL — ABNORMAL LOW (ref 4.0–10.5)
nRBC: 0 % (ref 0.0–0.2)

## 2024-01-17 LAB — CMP (CANCER CENTER ONLY)
ALT: 10 U/L (ref 0–44)
AST: 21 U/L (ref 15–41)
Albumin: 4.1 g/dL (ref 3.5–5.0)
Alkaline Phosphatase: 63 U/L (ref 38–126)
Anion gap: 8 (ref 5–15)
BUN: 16 mg/dL (ref 8–23)
CO2: 24 mmol/L (ref 22–32)
Calcium: 9.2 mg/dL (ref 8.9–10.3)
Chloride: 106 mmol/L (ref 98–111)
Creatinine: 0.92 mg/dL (ref 0.44–1.00)
GFR, Estimated: >60 mL/min (ref >60)
Glucose, Bld: 83 mg/dL (ref 70–99)
Potassium: 4.2 mmol/L (ref 3.5–5.1)
Sodium: 138 mmol/L (ref 135–145)
Total Bilirubin: 0.4 mg/dL (ref 0.0–1.2)
Total Protein: 6.3 g/dL — ABNORMAL LOW (ref 6.5–8.1)

## 2024-01-17 LAB — LACTATE DEHYDROGENASE: LDH: 190 U/L (ref 98–192)

## 2024-01-17 MED ORDER — SODIUM CHLORIDE 0.9 % IV SOLN
375.0000 mg/m2 | Freq: Once | INTRAVENOUS | Status: AC
  Administered 2024-01-17: 600 mg via INTRAVENOUS
  Filled 2024-01-17: qty 60

## 2024-01-17 MED ORDER — METHYLPREDNISOLONE SODIUM SUCC 125 MG IJ SOLR
62.5000 mg | Freq: Once | INTRAMUSCULAR | Status: AC
  Administered 2024-01-17: 62.5 mg via INTRAVENOUS
  Filled 2024-01-17: qty 2

## 2024-01-17 MED ORDER — SODIUM CHLORIDE 0.9 % IV SOLN: INTRAVENOUS | Status: DC

## 2024-01-17 MED ORDER — FAMOTIDINE IN NACL 20-0.9 MG/50ML-% IV SOLN
20.0000 mg | Freq: Once | INTRAVENOUS | Status: AC
  Administered 2024-01-17: 20 mg via INTRAVENOUS
  Filled 2024-01-17: qty 50

## 2024-01-17 MED ORDER — ACETAMINOPHEN 325 MG PO TABS
650.0000 mg | ORAL_TABLET | Freq: Once | ORAL | Status: AC
  Administered 2024-01-17: 650 mg via ORAL
  Filled 2024-01-17: qty 2

## 2024-01-17 MED ORDER — DIPHENHYDRAMINE HCL 25 MG PO CAPS
50.0000 mg | ORAL_CAPSULE | Freq: Once | ORAL | Status: AC
  Administered 2024-01-17: 50 mg via ORAL
  Filled 2024-01-17: qty 2

## 2024-01-17 NOTE — Patient Instructions (Signed)
 [facility] - A DEPT OF [facility]  Discharge Instructions: Thank you for choosing [facility] to provide your oncology and hematology care.   If you have a lab appointment with the Cancer Center, please go directly to the Cancer Center and check in at the registration area.   Wear comfortable clothing and clothing appropriate for easy access to any Portacath or PICC line.   We strive to give you quality time with your provider. You may need to reschedule your appointment if you arrive late (15 or more minutes).  Arriving late affects you and other patients whose appointments are after yours.  Also, if you miss three or more appointments without notifying the office, you may be dismissed from the clinic at the provider's discretion.      For prescription refill requests, have your pharmacy contact our office and allow 72 hours for refills to be completed.    Today you received the following chemotherapy and/or immunotherapy agents: Ruxience      To help prevent nausea and vomiting after your treatment, we encourage you to take your nausea medication as directed.  BELOW ARE SYMPTOMS THAT SHOULD BE REPORTED IMMEDIATELY: *FEVER GREATER THAN 100.4 F (38 C) OR HIGHER *CHILLS OR SWEATING *NAUSEA AND VOMITING THAT IS NOT CONTROLLED WITH YOUR NAUSEA MEDICATION *UNUSUAL SHORTNESS OF BREATH *UNUSUAL BRUISING OR BLEEDING *URINARY PROBLEMS (pain or burning when urinating, or frequent urination) *BOWEL PROBLEMS (unusual diarrhea, constipation, pain near the anus) TENDERNESS IN MOUTH AND THROAT WITH OR WITHOUT PRESENCE OF ULCERS (sore throat, sores in mouth, or a toothache) UNUSUAL RASH, SWELLING OR PAIN  UNUSUAL VAGINAL DISCHARGE OR ITCHING   Items with * indicate a potential emergency and should be followed up as soon as possible or go to the Emergency Department if any problems should occur.  Please show the CHEMOTHERAPY ALERT CARD or IMMUNOTHERAPY  ALERT CARD at check-in to the Emergency Department and triage nurse.  Should you have questions after your visit or need to cancel or reschedule your appointment, please contact [facility] - A DEPT OF [facility]  Dept: [phone]  and follow the prompts.  Office hours are 8:00 a.m. to 4:30 p.m. Monday - Friday. Please note that voicemails left after 4:00 p.m. may not be returned until the following business day.  We are closed weekends and major holidays. You have access to a nurse at all times for urgent questions. Please call the main number to the clinic Dept: [phone] and follow the prompts.   For any non-urgent questions, you may also contact your provider using MyChart. We now offer e-Visits for anyone 88 and older to request care online for non-urgent symptoms. For details visit [email or web address].   Also download the MyChart app! Go to the app store, search "MyChart", open the app, select [place], and log in with your MyChart username and password.

## 2024-01-24 ENCOUNTER — Inpatient Hospital Stay

## 2024-01-24 ENCOUNTER — Inpatient Hospital Stay: Attending: Physician Assistant

## 2024-01-24 ENCOUNTER — Inpatient Hospital Stay (HOSPITAL_BASED_OUTPATIENT_CLINIC_OR_DEPARTMENT_OTHER): Admitting: Hematology and Oncology

## 2024-01-24 VITALS — BP 137/59 | HR 70 | Temp 98.3°F | Resp 16 | Ht 65.0 in | Wt 113.0 lb

## 2024-01-24 VITALS — BP 117/52 | HR 79 | Temp 98.5°F | Resp 16

## 2024-01-24 DIAGNOSIS — R590 Localized enlarged lymph nodes: Secondary | ICD-10-CM | POA: Diagnosis not present

## 2024-01-24 DIAGNOSIS — C8258 Diffuse follicle center lymphoma, lymph nodes of multiple sites: Secondary | ICD-10-CM

## 2024-01-24 DIAGNOSIS — M129 Arthropathy, unspecified: Secondary | ICD-10-CM | POA: Diagnosis not present

## 2024-01-24 DIAGNOSIS — C8290 Follicular lymphoma, unspecified, unspecified site: Secondary | ICD-10-CM | POA: Insufficient documentation

## 2024-01-24 DIAGNOSIS — R232 Flushing: Secondary | ICD-10-CM | POA: Diagnosis not present

## 2024-01-24 DIAGNOSIS — N951 Menopausal and female climacteric states: Secondary | ICD-10-CM | POA: Diagnosis not present

## 2024-01-24 DIAGNOSIS — Z85828 Personal history of other malignant neoplasm of skin: Secondary | ICD-10-CM | POA: Diagnosis not present

## 2024-01-24 DIAGNOSIS — R519 Headache, unspecified: Secondary | ICD-10-CM | POA: Diagnosis not present

## 2024-01-24 DIAGNOSIS — Z5112 Encounter for antineoplastic immunotherapy: Secondary | ICD-10-CM | POA: Diagnosis not present

## 2024-01-24 DIAGNOSIS — R591 Generalized enlarged lymph nodes: Secondary | ICD-10-CM

## 2024-01-24 LAB — CBC WITH DIFFERENTIAL (CANCER CENTER ONLY)
Abs Immature Granulocytes: 0.02 K/uL (ref 0.00–0.07)
Basophils Absolute: 0.1 K/uL (ref 0.0–0.1)
Basophils Relative: 1 %
Eosinophils Absolute: 0.1 K/uL (ref 0.0–0.5)
Eosinophils Relative: 2 %
HCT: 33.2 % — ABNORMAL LOW (ref 36.0–46.0)
Hemoglobin: 11 g/dL — ABNORMAL LOW (ref 12.0–15.0)
Immature Granulocytes: 0 %
Lymphocytes Relative: 22 %
Lymphs Abs: 1.2 K/uL (ref 0.7–4.0)
MCH: 27.7 pg (ref 26.0–34.0)
MCHC: 33.1 g/dL (ref 30.0–36.0)
MCV: 83.6 fL (ref 80.0–100.0)
Monocytes Absolute: 0.5 K/uL (ref 0.1–1.0)
Monocytes Relative: 9 %
Neutro Abs: 3.7 K/uL (ref 1.7–7.7)
Neutrophils Relative %: 66 %
Platelet Count: 186 K/uL (ref 150–400)
RBC: 3.97 MIL/uL (ref 3.87–5.11)
RDW: 13.2 % (ref 11.5–15.5)
WBC Count: 5.7 K/uL (ref 4.0–10.5)
nRBC: 0 % (ref 0.0–0.2)

## 2024-01-24 LAB — CMP (CANCER CENTER ONLY)
ALT: 11 U/L (ref 0–44)
AST: 24 U/L (ref 15–41)
Albumin: 4.3 g/dL (ref 3.5–5.0)
Alkaline Phosphatase: 59 U/L (ref 38–126)
Anion gap: 6 (ref 5–15)
BUN: 16 mg/dL (ref 8–23)
CO2: 25 mmol/L (ref 22–32)
Calcium: 9.5 mg/dL (ref 8.9–10.3)
Chloride: 107 mmol/L (ref 98–111)
Creatinine: 0.94 mg/dL (ref 0.44–1.00)
GFR, Estimated: >60 mL/min (ref >60)
Glucose, Bld: 85 mg/dL (ref 70–99)
Potassium: 5 mmol/L (ref 3.5–5.1)
Sodium: 138 mmol/L (ref 135–145)
Total Bilirubin: 0.5 mg/dL (ref 0.0–1.2)
Total Protein: 6.6 g/dL (ref 6.5–8.1)

## 2024-01-24 LAB — LACTATE DEHYDROGENASE: LDH: 207 U/L — ABNORMAL HIGH (ref 98–192)

## 2024-01-24 MED ORDER — SODIUM CHLORIDE 0.9 % IV SOLN
375.0000 mg/m2 | Freq: Once | INTRAVENOUS | Status: AC
  Administered 2024-01-24: 600 mg via INTRAVENOUS
  Filled 2024-01-24: qty 50

## 2024-01-24 MED ORDER — ACETAMINOPHEN 325 MG PO TABS
650.0000 mg | ORAL_TABLET | Freq: Once | ORAL | Status: AC
  Administered 2024-01-24: 650 mg via ORAL
  Filled 2024-01-24: qty 2

## 2024-01-24 MED ORDER — DIPHENHYDRAMINE HCL 25 MG PO CAPS
50.0000 mg | ORAL_CAPSULE | Freq: Once | ORAL | Status: AC
  Administered 2024-01-24: 50 mg via ORAL
  Filled 2024-01-24: qty 2

## 2024-01-24 MED ORDER — SODIUM CHLORIDE 0.9 % IV SOLN: INTRAVENOUS | Status: DC

## 2024-01-24 MED ORDER — FAMOTIDINE IN NACL 20-0.9 MG/50ML-% IV SOLN
20.0000 mg | Freq: Once | INTRAVENOUS | Status: AC
  Administered 2024-01-24: 20 mg via INTRAVENOUS
  Filled 2024-01-24: qty 50

## 2024-01-24 MED ORDER — METHYLPREDNISOLONE SODIUM SUCC 125 MG IJ SOLR
62.5000 mg | Freq: Once | INTRAMUSCULAR | Status: AC
  Administered 2024-01-24: 62.5 mg via INTRAVENOUS
  Filled 2024-01-24: qty 2

## 2024-01-24 NOTE — Progress Notes (Signed)
 [facility] [phone]   [phone]  PROGRESS NOTE  Patient Care Team: Marvine Rush, MD as PCP - General (Family Medicine) Livingston Rigg, MD as Consulting Physician (Dermatology) Golden Forestine BROCKS, RN as Oncology Nurse Navigator (Medical Oncology)  Hematological/Oncological History # Follicular Lymphoma Stage III 06/27/2023: establish with [facility] clinic due to inguinal lymphadenopathy.  06/29/2023: CT C/A/P showed multiple enlarged lymph nodes in the chest, abdomen and pelvis.  Findings showed a right supraclavicular node measuring 12 x 22 mm, right axillary node 11 x 15 mm and hilar region 14 x 18 mm. 07/05/2023: needle biopsy of right inguinal lymph nodes shows low grade follicular lymphoma   Interval History:  Carolyn Massey 83 y.o. female with medical history significant for stage III follicular lymphoma who presents for a follow up visit. The patient's last visit was on 01/11/2024. In the interim since the last visit she started rituximab.   On exam today Carolyn Massey reports she had trouble with the first treatment where she had hot flashes, fogginess, and headache.  She received steroids and fluids and did not improve.  After premedications her second treatment went well with no difficulties.  She reports her energy levels are good and her appetite is strong.  She has not been having any trouble with nausea, vomiting, or diarrhea.  She reports that she is just about the same.  She is not currently having any pain.  She reports that her weight is down about 4 pounds since we started treatment.  Overall she is willing and able to continue on rituximab therapy at this time.  A full 10 point ROS is otherwise negative.  MEDICAL HISTORY:  Past Medical History:  Diagnosis Date   Anemia    Arthritis    shoulder   Basal cell carcinoma 02/15/1994   right shoulder(CX35FU), post neck (CX35FU)   Basal cell carcinoma 01/17/2000   upper Left lip (CX35FU)    Basal cell carcinoma 02/12/2001   superificial-mid chest (CX35FU), behind right shoulder (CX35FU)   Basal cell carcinoma 06/21/2001   ulcerated- Left bulb of nose (CX35FU)   Basal cell carcinoma 10/28/2002   sclerosis- over Right brow-(MOHS), sclerosis- Left tip nose (MOHS)   Basal cell carcinoma 06/30/2003   superificial-RIght inner mid calf (CX35FU)   Basal cell carcinoma 08/15/2005   superificial-Right chest (CX35FU)   Basal cell carcinoma 09/22/2009   superificial-mid spinal (CX35FU), Right nose-(CX35FU)   Basal cell carcinoma 06/21/2011   superificial- Right upper chest (SRT)   Basal cell carcinoma 02/05/2015   above Left upper lip inferior (MOHS), alove Left upper lip superior (MOHS)   Basal cell carcinoma 06/06/2016   sup&nod-Left forehead-(CX35FU), nod-RIght mid back-(CX35FU)   Basal cell carcinoma 04/23/2019   superifcial-RIght scapula, sclerosis-Right chest superior   Nodular basal cell carcinoma (BCC) 06/15/2020   Left Forearm anterior   SCCA (squamous cell carcinoma) of skin 06/15/2020   Dorsum of Nose (in situ)   Squamous cell carcinoma of skin 09/22/2009   in situ-Right collarbone(CX35FU),  insitu-mid chest (CX35FU), insitu-Right tip of nose (CX35FU)   Squamous cell carcinoma of skin 10/09/2017   in situ-Right post shoulder-(CX35FU), in situ-Left cheek (CX35FU)   Squamous cell carcinoma of skin 04/23/2019   insitu-Left upper arm, in situ-  below Right  knee outer    SURGICAL HISTORY: Past Surgical History:  Procedure Laterality Date   EYE SURGERY     to remove cataracts   MOHS SURGERY     x several over the years  SENTINEL NODE BIOPSY Right 07/05/2023   Procedure: BIOPSY, LYMPH NODE, SENTINEL;  Surgeon: Dasie Leonor CROME, MD;  Location: [facility] OR;  Service: General;  Laterality: Right;   TOTAL ABDOMINAL HYSTERECTOMY      SOCIAL HISTORY: Social History   Socioeconomic History   Marital status: Married    Spouse name: Not on file   Number of children: Not on  file   Years of education: Not on file   Highest education level: Not on file  Occupational History   Not on file  Tobacco Use   Smoking status: Never   Smokeless tobacco: Never  Vaping Use   Vaping status: Never Used  Substance and Sexual Activity   Alcohol use: Never   Drug use: Never   Sexual activity: Not Currently    Birth control/protection: Surgical    Comment: Hysterectomy  Other Topics Concern   Not on file  Social History Narrative   Not on file   Social Drivers of Health   Financial Resource Strain: Not on file  Food Insecurity: Not on file  Transportation Needs: Not on file  Physical Activity: Not on file  Stress: Not on file  Social Connections: Not on file  Intimate Partner Violence: Not on file    FAMILY HISTORY: No family history on file.  ALLERGIES:  is allergic to rituxan [rituximab].  MEDICATIONS:  No current outpatient medications on file.   No current facility-administered medications for this visit.   Facility-Administered Medications Ordered in Other Visits  Medication Dose Route Frequency Provider Last Rate Last Admin   0.9 %  sodium chloride infusion   Intravenous Continuous Federico Carolyn ONEIDA MADISON, MD 10 mL/hr at 01/24/24 0955 New Bag at 01/24/24 0955    REVIEW OF SYSTEMS:   Constitutional: ( - ) fevers, ( - )  chills , ( - ) night sweats Eyes: ( - ) blurriness of vision, ( - ) double vision, ( - ) watery eyes Ears, nose, mouth, throat, and face: ( - ) mucositis, ( - ) sore throat Respiratory: ( - ) cough, ( - ) dyspnea, ( - ) wheezes Cardiovascular: ( - ) palpitation, ( - ) chest discomfort, ( - ) lower extremity swelling Gastrointestinal:  ( - ) nausea, ( - ) heartburn, ( - ) change in bowel habits Skin: ( - ) abnormal skin rashes Lymphatics: ( - ) new lymphadenopathy, ( - ) easy bruising Neurological: ( - ) numbness, ( - ) tingling, ( - ) new weaknesses Behavioral/Psych: ( - ) mood change, ( - ) new changes  All other systems were  reviewed with the patient and are negative.  PHYSICAL EXAMINATION:  Vitals:   01/24/24 0903  BP: (!) 137/59  Pulse: 70  Resp: 16  Temp: 98.3 F (36.8 C)  SpO2: 100%     Filed Weights   01/24/24 0903  Weight: 113 lb (51.3 kg)      GENERAL: Well-appearing elderly Caucasian female, alert, no distress and comfortable SKIN: skin color, texture, turgor are normal, no rashes or significant lesions EYES: conjunctiva are pink and non-injected, sclera clear LUNGS: clear to auscultation and percussion with normal breathing effort HEART: regular rate & rhythm and no murmurs and no lower extremity edema GROIN: Well-healing incision site where right inguinal lymph node was removed. Musculoskeletal: no cyanosis of digits and no clubbing  PSYCH: alert & oriented x 3, fluent speech NEURO: no focal motor/sensory deficits  LABORATORY DATA:  I have reviewed the data as listed  Latest Ref Rng & Units 01/24/2024    8:44 AM 01/17/2024   12:15 PM 01/11/2024    8:08 AM  CBC  WBC 4.0 - 10.5 K/uL 5.7  3.8  4.9   Hemoglobin 12.0 - 15.0 g/dL 88.9  89.4  88.8   Hematocrit 36.0 - 46.0 % 33.2  31.2  35.0   Platelets 150 - 400 K/uL 186  163  160        Latest Ref Rng & Units 01/24/2024    8:44 AM 01/17/2024   12:15 PM 01/11/2024    8:08 AM  CMP  Glucose 70 - 99 mg/dL 85  83  95   BUN 8 - 23 mg/dL 16  16  15    Creatinine 0.44 - 1.00 mg/dL 9.05  9.07  8.93   Sodium 135 - 145 mmol/L 138  138  141   Potassium 3.5 - 5.1 mmol/L 5.0  4.2  4.3   Chloride 98 - 111 mmol/L 107  106  109   CO2 22 - 32 mmol/L 25  24  25    Calcium 8.9 - 10.3 mg/dL 9.5  9.2  9.4   Total Protein 6.5 - 8.1 g/dL 6.6  6.3  6.5   Total Bilirubin 0.0 - 1.2 mg/dL 0.5  0.4  0.5   Alkaline Phos 38 - 126 U/L 59  63  67   AST 15 - 41 U/L 24  21  25    ALT 0 - 44 U/L 11  10  8      No results found for: MPROTEIN No results found for: KPAFRELGTCHN, LAMBDASER, KAPLAMBRATIO  RADIOGRAPHIC STUDIES: I have personally  reviewed the radiological images as listed and agreed with the findings in the report. No results found.   ASSESSMENT & PLAN NATISHA TRZCINSKI 83 y.o. female with medical history significant for stage III follicular lymphoma who presents for a follow up visit.   Today we discussed the diagnosis of follicular lymphoma and steps moving forward.  We discussed that this tends to be a more indolent lymphoma and does not immediately require treatment unless there are issues with cytopenias, bulky disease, or symptoms.  We discussed criteria for treatment and the risk of progression.  The patient voiced understanding of our findings and plan moving forward.  GELF Criteria: 1. Indication for treatment based on conglomerated mesenteric lymph nodes.  FLIPI Score: 3. High risk.   # Follicular Lymphoma Stage III --Findings at this time are consistent with a low-grade follicular lymphoma, stage III based on imaging. --labs today show white blood cell 5.7, Hgb 11.0, MCV 83.6, Plt 186.  LFTs and creatinine within normal limits. --Baseline labs show no evidence of severe cytopenias or other organ dysfunction. -- CT scan performed on 12/24/2023 showed clear evidence of bulky lymphadenopathy with enlarged lymph nodes, several greater than 3 cm and 1 large conglomeration greater than 10 cm.  Due to this the patient meets criteria for treatment. --Will plan to proceed with rituximab monotherapy x 4 weeks, followed by maintenance rituximab every 2 months x 2 years. Due for Week 3 of rituximab.  --Will plan for repeat scan in late Dec/Jan 2026  --Patient voiced understanding of our findings and plan moving forward. --RTC for Week 4 next week and 1 month thereafter with interval PET CT scan .    Orders Placed This Encounter  Procedures   NM PET Image Restag (PS) Skull Base To Thigh    Standing Status:   Future    Expected  Date:   03/06/2024    Expiration Date:   01/23/2025    If indicated for the ordered  procedure, I authorize the administration of a radiopharmaceutical per Radiology protocol:   Yes    Preferred imaging location?:   [facility]    All questions were answered. The patient knows to call the clinic with any problems, questions or concerns.  A total of more than 30 minutes were spent on this encounter with face-to-face time and non-face-to-face time, including preparing to see the patient, ordering tests and/or medications, counseling the patient and coordination of care as outlined above.   Carolyn IVAR Kidney, MD Department of Hematology/Oncology [facility] at [facility] Phone: [phone] Pager: [phone] Email: [email or web address]  01/24/2024 11:17 AM

## 2024-01-24 NOTE — Patient Instructions (Signed)
 [facility] - A DEPT OF [place]  Discharge Instructions: Thank you for choosing [facility] to provide your oncology and hematology care.   If you have a lab appointment with the Cancer Center, please go directly to the Cancer Center and check in at the registration area.   Wear comfortable clothing and clothing appropriate for easy access to any Portacath or PICC line.   We strive to give you quality time with your provider. You may need to reschedule your appointment if you arrive late (15 or more minutes).  Arriving late affects you and other patients whose appointments are after yours.  Also, if you miss three or more appointments without notifying the office, you may be dismissed from the clinic at the provider's discretion.      For prescription refill requests, have your pharmacy contact our office and allow 72 hours for refills to be completed.    Today you received the following chemotherapy and/or immunotherapy agents: riTUXimab -pvvr (RUXIENCE )     To help prevent nausea and vomiting after your treatment, we encourage you to take your nausea medication as directed.  BELOW ARE SYMPTOMS THAT SHOULD BE REPORTED IMMEDIATELY: *FEVER GREATER THAN 100.4 F (38 C) OR HIGHER *CHILLS OR SWEATING *NAUSEA AND VOMITING THAT IS NOT CONTROLLED WITH YOUR NAUSEA MEDICATION *UNUSUAL SHORTNESS OF BREATH *UNUSUAL BRUISING OR BLEEDING *URINARY PROBLEMS (pain or burning when urinating, or frequent urination) *BOWEL PROBLEMS (unusual diarrhea, constipation, pain near the anus) TENDERNESS IN MOUTH AND THROAT WITH OR WITHOUT PRESENCE OF ULCERS (sore throat, sores in mouth, or a toothache) UNUSUAL RASH, SWELLING OR PAIN  UNUSUAL VAGINAL DISCHARGE OR ITCHING   Items with * indicate a potential emergency and should be followed up as soon as possible or go to the Emergency Department if any problems should occur.  Please show the CHEMOTHERAPY ALERT CARD or  IMMUNOTHERAPY ALERT CARD at check-in to the Emergency Department and triage nurse.  Should you have questions after your visit or need to cancel or reschedule your appointment, please contact [facility] - A DEPT OF [facility]  Dept: [phone]  and follow the prompts.  Office hours are 8:00 a.m. to 4:30 p.m. Monday - Friday. Please note that voicemails left after 4:00 p.m. may not be returned until the following business day.  We are closed weekends and major holidays. You have access to a nurse at all times for urgent questions. Please call the main number to the clinic Dept: [phone] and follow the prompts.   For any non-urgent questions, you may also contact your provider using MyChart. We now offer e-Visits for anyone 65 and older to request care online for non-urgent symptoms. For details visit [email or web address].   Also download the MyChart app! Go to the app store, search "MyChart", open the app, select [place], and log in with your MyChart username and password.

## 2024-01-27 ENCOUNTER — Other Ambulatory Visit: Payer: Self-pay

## 2024-01-31 ENCOUNTER — Inpatient Hospital Stay

## 2024-01-31 VITALS — BP 117/52 | HR 72 | Temp 97.8°F | Resp 16 | Wt 114.8 lb

## 2024-01-31 DIAGNOSIS — C8258 Diffuse follicle center lymphoma, lymph nodes of multiple sites: Secondary | ICD-10-CM

## 2024-01-31 DIAGNOSIS — Z5112 Encounter for antineoplastic immunotherapy: Secondary | ICD-10-CM | POA: Diagnosis not present

## 2024-01-31 LAB — CMP (CANCER CENTER ONLY)
ALT: 8 U/L (ref 0–44)
AST: 19 U/L (ref 15–41)
Albumin: 3.9 g/dL (ref 3.5–5.0)
Alkaline Phosphatase: 59 U/L (ref 38–126)
Anion gap: 5 (ref 5–15)
BUN: 21 mg/dL (ref 8–23)
CO2: 26 mmol/L (ref 22–32)
Calcium: 9.3 mg/dL (ref 8.9–10.3)
Chloride: 108 mmol/L (ref 98–111)
Creatinine: 0.93 mg/dL (ref 0.44–1.00)
GFR, Estimated: >60 mL/min (ref >60)
Glucose, Bld: 82 mg/dL (ref 70–99)
Potassium: 4.6 mmol/L (ref 3.5–5.1)
Sodium: 139 mmol/L (ref 135–145)
Total Bilirubin: 0.5 mg/dL (ref 0.0–1.2)
Total Protein: 6 g/dL — ABNORMAL LOW (ref 6.5–8.1)

## 2024-01-31 LAB — CBC WITH DIFFERENTIAL (CANCER CENTER ONLY)
Abs Immature Granulocytes: 0.01 K/uL (ref 0.00–0.07)
Basophils Absolute: 0.1 K/uL (ref 0.0–0.1)
Basophils Relative: 1 %
Eosinophils Absolute: 0.1 K/uL (ref 0.0–0.5)
Eosinophils Relative: 3 %
HCT: 31.4 % — ABNORMAL LOW (ref 36.0–46.0)
Hemoglobin: 10.2 g/dL — ABNORMAL LOW (ref 12.0–15.0)
Immature Granulocytes: 0 %
Lymphocytes Relative: 20 %
Lymphs Abs: 1.1 K/uL (ref 0.7–4.0)
MCH: 27.3 pg (ref 26.0–34.0)
MCHC: 32.5 g/dL (ref 30.0–36.0)
MCV: 84.2 fL (ref 80.0–100.0)
Monocytes Absolute: 0.5 K/uL (ref 0.1–1.0)
Monocytes Relative: 9 %
Neutro Abs: 3.7 K/uL (ref 1.7–7.7)
Neutrophils Relative %: 67 %
Platelet Count: 171 K/uL (ref 150–400)
RBC: 3.73 MIL/uL — ABNORMAL LOW (ref 3.87–5.11)
RDW: 13.3 % (ref 11.5–15.5)
WBC Count: 5.5 K/uL (ref 4.0–10.5)
nRBC: 0 % (ref 0.0–0.2)

## 2024-01-31 LAB — LACTATE DEHYDROGENASE: LDH: 174 U/L (ref 105–235)

## 2024-01-31 MED ORDER — METHYLPREDNISOLONE SODIUM SUCC 125 MG IJ SOLR
62.5000 mg | Freq: Once | INTRAMUSCULAR | Status: AC
  Administered 2024-01-31: 62.5 mg via INTRAVENOUS
  Filled 2024-01-31: qty 2

## 2024-01-31 MED ORDER — SODIUM CHLORIDE 0.9 % IV SOLN: INTRAVENOUS | Status: DC

## 2024-01-31 MED ORDER — FAMOTIDINE IN NACL 20-0.9 MG/50ML-% IV SOLN
20.0000 mg | Freq: Once | INTRAVENOUS | Status: AC
  Administered 2024-01-31: 20 mg via INTRAVENOUS
  Filled 2024-01-31: qty 50

## 2024-01-31 MED ORDER — SODIUM CHLORIDE 0.9 % IV SOLN
375.0000 mg/m2 | Freq: Once | INTRAVENOUS | Status: AC
  Administered 2024-01-31: 600 mg via INTRAVENOUS
  Filled 2024-01-31: qty 10

## 2024-01-31 MED ORDER — CETIRIZINE HCL 10 MG PO TABS
10.0000 mg | ORAL_TABLET | Freq: Once | ORAL | Status: AC
  Administered 2024-01-31: 10 mg via ORAL
  Filled 2024-01-31: qty 1

## 2024-01-31 MED ORDER — ACETAMINOPHEN 325 MG PO TABS
650.0000 mg | ORAL_TABLET | Freq: Once | ORAL | Status: AC
  Administered 2024-01-31: 650 mg via ORAL
  Filled 2024-01-31: qty 2

## 2024-01-31 NOTE — Patient Instructions (Signed)
 [facility] - A DEPT OF [place]  Discharge Instructions: Thank you for choosing [facility] to provide your oncology and hematology care.   If you have a lab appointment with the Cancer Center, please go directly to the Cancer Center and check in at the registration area.   Wear comfortable clothing and clothing appropriate for easy access to any Portacath or PICC line.   We strive to give you quality time with your provider. You may need to reschedule your appointment if you arrive late (15 or more minutes).  Arriving late affects you and other patients whose appointments are after yours.  Also, if you miss three or more appointments without notifying the office, you may be dismissed from the clinic at the provider's discretion.      For prescription refill requests, have your pharmacy contact our office and allow 72 hours for refills to be completed.    Today you received the following chemotherapy and/or immunotherapy agents: riTUXimab -pvvr (RUXIENCE )     To help prevent nausea and vomiting after your treatment, we encourage you to take your nausea medication as directed.  BELOW ARE SYMPTOMS THAT SHOULD BE REPORTED IMMEDIATELY: *FEVER GREATER THAN 100.4 F (38 C) OR HIGHER *CHILLS OR SWEATING *NAUSEA AND VOMITING THAT IS NOT CONTROLLED WITH YOUR NAUSEA MEDICATION *UNUSUAL SHORTNESS OF BREATH *UNUSUAL BRUISING OR BLEEDING *URINARY PROBLEMS (pain or burning when urinating, or frequent urination) *BOWEL PROBLEMS (unusual diarrhea, constipation, pain near the anus) TENDERNESS IN MOUTH AND THROAT WITH OR WITHOUT PRESENCE OF ULCERS (sore throat, sores in mouth, or a toothache) UNUSUAL RASH, SWELLING OR PAIN  UNUSUAL VAGINAL DISCHARGE OR ITCHING   Items with * indicate a potential emergency and should be followed up as soon as possible or go to the Emergency Department if any problems should occur.  Please show the CHEMOTHERAPY ALERT CARD or  IMMUNOTHERAPY ALERT CARD at check-in to the Emergency Department and triage nurse.  Should you have questions after your visit or need to cancel or reschedule your appointment, please contact [facility] - A DEPT OF [facility]  Dept: [phone]  and follow the prompts.  Office hours are 8:00 a.m. to 4:30 p.m. Monday - Friday. Please note that voicemails left after 4:00 p.m. may not be returned until the following business day.  We are closed weekends and major holidays. You have access to a nurse at all times for urgent questions. Please call the main number to the clinic Dept: [phone] and follow the prompts.   For any non-urgent questions, you may also contact your provider using MyChart. We now offer e-Visits for anyone 65 and older to request care online for non-urgent symptoms. For details visit [email or web address].   Also download the MyChart app! Go to the app store, search "MyChart", open the app, select [place], and log in with your MyChart username and password.

## 2024-02-06 ENCOUNTER — Encounter: Payer: Self-pay | Admitting: Oncology

## 2024-03-05 ENCOUNTER — Encounter: Payer: Self-pay | Admitting: Hematology and Oncology

## 2024-03-06 ENCOUNTER — Encounter (HOSPITAL_COMMUNITY)
Admission: RE | Admit: 2024-03-06 | Discharge: 2024-03-06 | Disposition: A | Discharge status: Home / Self Care | Source: Ambulatory Visit | Attending: Hematology and Oncology | Admitting: Hematology and Oncology

## 2024-03-06 DIAGNOSIS — C8258 Diffuse follicle center lymphoma, lymph nodes of multiple sites: Secondary | ICD-10-CM | POA: Diagnosis present

## 2024-03-06 LAB — GLUCOSE, CAPILLARY: Glucose-Capillary: 105 mg/dL — ABNORMAL HIGH (ref 70–99)

## 2024-03-06 MED ORDER — FLUDEOXYGLUCOSE F - 18 (FDG) INJECTION
5.7000 | Freq: Once | INTRAVENOUS | Status: AC
  Administered 2024-03-06: 10:00:00 5.68 via INTRAVENOUS

## 2024-03-11 ENCOUNTER — Inpatient Hospital Stay: Admitting: Hematology and Oncology

## 2024-03-11 ENCOUNTER — Inpatient Hospital Stay: Attending: Physician Assistant

## 2024-03-11 VITALS — BP 152/68 | HR 69 | Temp 97.9°F | Resp 16 | Ht 65.0 in | Wt 114.0 lb

## 2024-03-11 DIAGNOSIS — Z9221 Personal history of antineoplastic chemotherapy: Secondary | ICD-10-CM | POA: Insufficient documentation

## 2024-03-11 DIAGNOSIS — R591 Generalized enlarged lymph nodes: Secondary | ICD-10-CM

## 2024-03-11 DIAGNOSIS — C8228 Follicular lymphoma grade III, unspecified, lymph nodes of multiple sites: Secondary | ICD-10-CM | POA: Insufficient documentation

## 2024-03-11 DIAGNOSIS — C8258 Diffuse follicle center lymphoma, lymph nodes of multiple sites: Secondary | ICD-10-CM

## 2024-03-11 LAB — CMP (CANCER CENTER ONLY)
ALT: 11 U/L (ref 0–44)
AST: 28 U/L (ref 15–41)
Albumin: 4.6 g/dL (ref 3.5–5.0)
Alkaline Phosphatase: 62 U/L (ref 38–126)
Anion gap: 9 (ref 5–15)
BUN: 20 mg/dL (ref 8–23)
CO2: 25 mmol/L (ref 22–32)
Calcium: 9.6 mg/dL (ref 8.9–10.3)
Chloride: 103 mmol/L (ref 98–111)
Creatinine: 0.93 mg/dL (ref 0.44–1.00)
GFR, Estimated: >60 mL/min
Glucose, Bld: 93 mg/dL (ref 70–99)
Potassium: 4.5 mmol/L (ref 3.5–5.1)
Sodium: 136 mmol/L (ref 135–145)
Total Bilirubin: 0.3 mg/dL (ref 0.0–1.2)
Total Protein: 6.7 g/dL (ref 6.5–8.1)

## 2024-03-11 LAB — CBC WITH DIFFERENTIAL (CANCER CENTER ONLY)
Abs Immature Granulocytes: 0.03 K/uL (ref 0.00–0.07)
Basophils Absolute: 0 K/uL (ref 0.0–0.1)
Basophils Relative: 1 %
Eosinophils Absolute: 0.1 K/uL (ref 0.0–0.5)
Eosinophils Relative: 2 %
HCT: 34.2 % — ABNORMAL LOW (ref 36.0–46.0)
Hemoglobin: 11.4 g/dL — ABNORMAL LOW (ref 12.0–15.0)
Immature Granulocytes: 1 %
Lymphocytes Relative: 29 %
Lymphs Abs: 1.3 K/uL (ref 0.7–4.0)
MCH: 27.4 pg (ref 26.0–34.0)
MCHC: 33.3 g/dL (ref 30.0–36.0)
MCV: 82.2 fL (ref 80.0–100.0)
Monocytes Absolute: 0.5 K/uL (ref 0.1–1.0)
Monocytes Relative: 11 %
Neutro Abs: 2.6 K/uL (ref 1.7–7.7)
Neutrophils Relative %: 56 %
Platelet Count: 173 K/uL (ref 150–400)
RBC: 4.16 MIL/uL (ref 3.87–5.11)
RDW: 12.8 % (ref 11.5–15.5)
WBC Count: 4.6 K/uL (ref 4.0–10.5)
nRBC: 0 % (ref 0.0–0.2)

## 2024-03-11 LAB — LACTATE DEHYDROGENASE: LDH: 324 U/L — ABNORMAL HIGH (ref 105–235)

## 2024-03-11 NOTE — Progress Notes (Signed)
 DISCONTINUE ON PATHWAY REGIMEN - Lymphoma and CLL     A cycle is every 7 days:     Rituximab -xxxx   **Always confirm dose/schedule in your pharmacy ordering system**  PRIOR TREATMENT: LYOS201: Rituximab  375 mg/m2 IV Weekly x 4 Weeks  START OFF PATHWAY REGIMEN - Lymphoma and CLL   [id number]:Rituximab  IV/SUBQ D1 q56 Days:   Cycle 1: A cycle is 56 days:     Rituximab -xxxx    Cycles 2 and beyond: A cycle is every 56 days:     Rituximab  and hyaluronidase human   **Always confirm dose/schedule in your pharmacy ordering system**  Patient Characteristics: Follicular Lymphoma, Grades 1, 2, and 3A, First Line, Stage III / IV, Symptomatic or Bulky Disease Disease Type: Not Applicable Disease Type: Follicular Lymphoma, Grade 1, 2, or 3A Disease Type: Not Applicable Line of Therapy: First Line Disease Characteristics: Symptomatic or Bulky Disease Intent of Therapy: Curative Intent, Discussed with Patient

## 2024-03-11 NOTE — Progress Notes (Signed)
 " [facility] [phone]   [phone]) [phone]  PROGRESS NOTE  Patient Care Team: Marvine Rush, MD as PCP - General (Family Medicine) Livingston Rigg, MD as Consulting Physician (Dermatology) Golden Forestine BROCKS, RN as Oncology Nurse Navigator (Medical Oncology)  Hematological/Oncological History # Follicular Lymphoma Stage III 06/27/2023: establish with [facility] clinic due to inguinal lymphadenopathy.  06/29/2023: CT C/A/P showed multiple enlarged lymph nodes in the chest, abdomen and pelvis.  Findings showed a right supraclavicular node measuring 12 x 22 mm, right axillary node 11 x 15 mm and hilar region 14 x 18 mm. 07/05/2023: needle biopsy of right inguinal lymph nodes shows low grade follicular lymphoma   Interval History:  Carolyn Massey 83 y.o. female with medical history significant for stage III follicular lymphoma who presents for a follow up visit. The patient's last visit was on 01/24/2024. In the interim since the last visit she completed rituximab  and underwent a post treatment PET CT scan.   On exam today [name] reports she tolerated the rituximab  therapy well with no major side effects or symptoms.  She is not have any issues with fatigue, nausea, vomiting or diarrhea.  She reports her energy today is about a 9 out of 10 and her appetite is strong.  She has had no issues with fevers, chills, sweats.  She reports that she did get her flu shot this year.  She notes that she has no bumps or lumps concerning for lymphadenopathy.  She is also had no hospitalizations, ER visits, or other changes in her health.  Overall she is willing and able to proceed with rituximab  maintenance therapy at this time.  We discussed the risks and benefits of this treatment and she was agreeable to proceeding.  We also discussed the results of her PET CT scan.  Details of our conversation are noted below.  MEDICAL HISTORY:  Past Medical History:  Diagnosis Date   Anemia     Arthritis    shoulder   Basal cell carcinoma 02/15/1994   right shoulder(CX35FU), post neck (CX35FU)   Basal cell carcinoma 01/17/2000   upper Left lip (CX35FU)   Basal cell carcinoma 02/12/2001   superificial-mid chest (CX35FU), behind right shoulder (CX35FU)   Basal cell carcinoma 06/21/2001   ulcerated- Left bulb of nose (CX35FU)   Basal cell carcinoma 10/28/2002   sclerosis- over Right brow-(MOHS), sclerosis- Left tip nose (MOHS)   Basal cell carcinoma 06/30/2003   superificial-RIght inner mid calf (CX35FU)   Basal cell carcinoma 08/15/2005   superificial-Right chest (CX35FU)   Basal cell carcinoma 09/22/2009   superificial-mid spinal (CX35FU), Right nose-(CX35FU)   Basal cell carcinoma 06/21/2011   superificial- Right upper chest (SRT)   Basal cell carcinoma 02/05/2015   above Left upper lip inferior (MOHS), alove Left upper lip superior (MOHS)   Basal cell carcinoma 06/06/2016   sup&nod-Left forehead-(CX35FU), nod-RIght mid back-(CX35FU)   Basal cell carcinoma 04/23/2019   superifcial-RIght scapula, sclerosis-Right chest superior   Nodular basal cell carcinoma (BCC) 06/15/2020   Left Forearm anterior   SCCA (squamous cell carcinoma) of skin 06/15/2020   Dorsum of Nose (in situ)   Squamous cell carcinoma of skin 09/22/2009   in situ-Right collarbone(CX35FU),  insitu-mid chest (CX35FU), insitu-Right tip of nose (CX35FU)   Squamous cell carcinoma of skin 10/09/2017   in situ-Right post shoulder-(CX35FU), in situ-Left cheek (CX35FU)   Squamous cell carcinoma of skin 04/23/2019   insitu-Left upper arm, in situ-  below Right  knee outer  SURGICAL HISTORY: Past Surgical History:  Procedure Laterality Date   EYE SURGERY     to remove cataracts   MOHS SURGERY     x several over the years   SENTINEL NODE BIOPSY Right 07/05/2023   Procedure: BIOPSY, LYMPH NODE, SENTINEL;  Surgeon: Dasie Leonor CROME, MD;  Location: [facility] OR;  Service: General;  Laterality: Right;   TOTAL  ABDOMINAL HYSTERECTOMY      SOCIAL HISTORY: Social History   Socioeconomic History   Marital status: Married    Spouse name: Not on file   Number of children: Not on file   Years of education: Not on file   Highest education level: Not on file  Occupational History   Not on file  Tobacco Use   Smoking status: Never   Smokeless tobacco: Never  Vaping Use   Vaping status: Never Used  Substance and Sexual Activity   Alcohol use: Never   Drug use: Never   Sexual activity: Not Currently    Birth control/protection: Surgical    Comment: Hysterectomy  Other Topics Concern   Not on file  Social History Narrative   Not on file   Social Drivers of Health   Tobacco Use: Low Risk  (08/01/2023)   Received from [facility]   Patient History    Smoking Tobacco Use: Never    Smokeless Tobacco Use: Never    Passive Exposure: Not on file  Financial Resource Strain: Not on file  Food Insecurity: Not on file  Transportation Needs: Not on file  Physical Activity: Not on file  Stress: Not on file  Social Connections: Not on file  Intimate Partner Violence: Not on file  Depression (PHQ2-9): Low Risk (03/11/2024)   Depression (PHQ2-9)    PHQ-2 Score: 0  Alcohol Screen: Not on file  Housing: Unknown (07/04/2023)   Received from [facility]   Epic    Unable to Pay for Housing in the Last Year: Not on file    Number of Times Moved in the Last Year: Not on file    At any time in the past 12 months, were you homeless or living in a shelter (including now)?: No  Utilities: Not on file  Health Literacy: Not on file    FAMILY HISTORY: No family history on file.  ALLERGIES:  is allergic to rituxan  [rituximab ].  MEDICATIONS:  Current Outpatient Medications  Medication Sig Dispense Refill   vitamin B-12 (CYANOCOBALAMIN) 500 MCG tablet Take 500 mcg by mouth daily.     No current facility-administered medications for this visit.    REVIEW OF  SYSTEMS:   Constitutional: ( - ) fevers, ( - )  chills , ( - ) night sweats Eyes: ( - ) blurriness of vision, ( - ) double vision, ( - ) watery eyes Ears, nose, mouth, throat, and face: ( - ) mucositis, ( - ) sore throat Respiratory: ( - ) cough, ( - ) dyspnea, ( - ) wheezes Cardiovascular: ( - ) palpitation, ( - ) chest discomfort, ( - ) lower extremity swelling Gastrointestinal:  ( - ) nausea, ( - ) heartburn, ( - ) change in bowel habits Skin: ( - ) abnormal skin rashes Lymphatics: ( - ) new lymphadenopathy, ( - ) easy bruising Neurological: ( - ) numbness, ( - ) tingling, ( - ) new weaknesses Behavioral/Psych: ( - ) mood change, ( - ) new changes  All other systems were reviewed with the patient and are  negative.  PHYSICAL EXAMINATION:  Vitals:   03/11/24 1117 03/11/24 1127  BP: (!) 150/60 (!) 152/68  Pulse: 69   Resp: 16   Temp: 97.9 F (36.6 C)   SpO2: 100%    Filed Weights   03/11/24 1117  Weight: 114 lb (51.7 kg)    GENERAL: Well-appearing elderly Caucasian female, alert, no distress and comfortable SKIN: skin color, texture, turgor are normal, no rashes or significant lesions EYES: conjunctiva are pink and non-injected, sclera clear LUNGS: clear to auscultation and percussion with normal breathing effort HEART: regular rate & rhythm and no murmurs and no lower extremity edema GROIN: Well-healing incision site where right inguinal lymph node was removed. Musculoskeletal: no cyanosis of digits and no clubbing  PSYCH: alert & oriented x 3, fluent speech NEURO: no focal motor/sensory deficits  LABORATORY DATA:  I have reviewed the data as listed    Latest Ref Rng & Units 03/11/2024   10:41 AM 01/31/2024    7:42 AM 01/24/2024    8:44 AM  CBC  WBC 4.0 - 10.5 K/uL 4.6  5.5  5.7   Hemoglobin 12.0 - 15.0 g/dL 88.5  89.7  88.9   Hematocrit 36.0 - 46.0 % 34.2  31.4  33.2   Platelets 150 - 400 K/uL 173  171  186        Latest Ref Rng & Units 03/11/2024   10:41 AM  01/31/2024    7:42 AM 01/24/2024    8:44 AM  CMP  Glucose 70 - 99 mg/dL 93  82  85   BUN 8 - 23 mg/dL 20  21  16    Creatinine 0.44 - 1.00 mg/dL 9.06  9.06  9.05   Sodium 135 - 145 mmol/L 136  139  138   Potassium 3.5 - 5.1 mmol/L 4.5  4.6  5.0   Chloride 98 - 111 mmol/L 103  108  107   CO2 22 - 32 mmol/L 25  26  25    Calcium 8.9 - 10.3 mg/dL 9.6  9.3  9.5   Total Protein 6.5 - 8.1 g/dL 6.7  6.0  6.6   Total Bilirubin 0.0 - 1.2 mg/dL 0.3  0.5  0.5   Alkaline Phos 38 - 126 U/L 62  59  59   AST 15 - 41 U/L 28  19  24    ALT 0 - 44 U/L 11  8  11      No results found for: MPROTEIN No results found for: KPAFRELGTCHN, LAMBDASER, KAPLAMBRATIO  RADIOGRAPHIC STUDIES: NM PET Image Initial (PI) Skull Base To Thigh (F-18 FDG) Result Date: 03/07/2024 EXAM: PET AND CT SKULL BASE TO MID THIGH 03/06/2024 11:22:41 AM TECHNIQUE: RADIOPHARMACEUTICAL: 5.68 mCi F-18 FDG (Fludeoxyglucose  F-18) Uptake time 60 minutes. Glucose level 105 mg/dl. PET imaging was acquired from the base of the skull to the mid thighs. Non-contrast enhanced computed tomography was obtained for attenuation correction and anatomic localization. COMPARISON: CT 12/24/2023. CLINICAL HISTORY: Hematologic malignancy, assess treatment response. Diffuse follicle center lymphoma of lymph nodes of multiple regions. Right groin lymph node biopsy 4/17. FINDINGS: background blood pool activity equal 2.1 SUV max. Background liver activity equal SUV max equal 2.7. Partial response to therapy with decrease in size of bulky thoracic and abdominal lymphadenopathy. The remaining enlarged lymph nodes do have significant metabolic activity above background liver activity (Deauville 4 category). HEAD AND NECK: Metabolic activity of the strap muscles of the neck is favorably benign. No metabolically active cervical lymphadenopathy. CHEST: Mediastinal lymph nodes  are improved compared to prior. There is persistence right hilar lymph node with SUV max equal  4.5. No metabolically active pulmonary nodules. ABDOMEN AND PELVIS: The majority of the adenopathy is in the abdomen. There is persistent enlarged upper abdomen adenopathy and central mesenteric adenopathy. For example, a mat-like cluster of nodes in the central mesentery measures 7.3 x 3.8 cm with mild metabolic activity, SUV max 3.9, which is greater than background liver activity. The nodal mass is decreased from 10.4 x 4.4 cm. Likewise, periaortic nodes have decreased in size but still remain enlarged. For example, a lymph node left of the aorta measuring 20 mm on image 117 compared to 28 mm. This node has moderate metabolic activity with SUV max of 3.7. BONES AND SOFT TISSUE: Focal activity within the T11 vertebral body consistent with skeletal metastasis. There is a focus of intense metabolic activity associated with the right pedicle of the T11 vertebral body with SUV max equal 5.2 on image 91. No clear CT correlation. This is the only skeletal lesion identified. IMPRESSION: 1. Partial response to therapy with decrease in size of bulky thoracic and abdominal lymphadenopathy 2. The remaining enlarged lymph nodes demonstrate metabolic activity above background liver activity, consistent with Deauville 4. 3. Focal activity within the T11 vertebral body consistent with skeletal metastasis. Electronically signed by: Norleen Boxer MD 03/07/2024 04:40 PM EST RP Workstation: [id number]     ASSESSMENT & PLAN Carolyn Massey 83 y.o. female with medical history significant for stage III follicular lymphoma who presents for a follow up visit.   Previously we discussed the diagnosis of follicular lymphoma and steps moving forward.  We discussed that this tends to be a more indolent lymphoma and does not immediately require treatment unless there are issues with cytopenias, bulky disease, or symptoms.  We discussed criteria for treatment and the risk of progression.  The patient voiced understanding of our findings  and plan moving forward.  GELF Criteria: 1. Indication for treatment based on conglomerated mesenteric lymph nodes.  FLIPI Score: 3. High risk.   # Follicular Lymphoma Stage III --Findings at this time are consistent with a low-grade follicular lymphoma, stage III based on imaging. --labs today show white blood cell 4.6, hemoglobin 11.4, MCV 82.2, platelets 173.  LFTs and creatinine within normal limits. --Baseline labs show no evidence of severe cytopenias or other organ dysfunction. -- CT scan performed on 12/24/2023 showed clear evidence of bulky lymphadenopathy with enlarged lymph nodes, several greater than 3 cm and 1 large conglomeration greater than 10 cm.  Due to this the patient met criteria for treatment. --PET CT scan showed a partial response to therapy with decreased size of lymph nodes and Deauville Score of 4.  --Will plan to proceed with maintenance rituximab  every 2 months x 2 years.  Next treatment in January 2026. --Patient voiced understanding of our findings and plan moving forward. --RTC for the start of maintenance rituximab  in 4 weeks time, 8 weeks thereafter.    No orders of the defined types were placed in this encounter.   All questions were answered. The patient knows to call the clinic with any problems, questions or concerns.  A total of more than 30 minutes were spent on this encounter with face-to-face time and non-face-to-face time, including preparing to see the patient, ordering tests and/or medications, counseling the patient and coordination of care as outlined above.   Norleen IVAR Kidney, MD Department of Hematology/Oncology [facility] Phone: [phone]  Pager: [phone] Email: [email or web address]  03/11/2024 11:40 AM  "

## 2024-03-12 ENCOUNTER — Other Ambulatory Visit: Payer: Self-pay

## 2024-03-16 ENCOUNTER — Encounter: Payer: Self-pay | Admitting: Hematology and Oncology

## 2024-03-17 ENCOUNTER — Other Ambulatory Visit: Payer: Self-pay

## 2024-04-16 ENCOUNTER — Other Ambulatory Visit: Payer: Self-pay | Admitting: Hematology and Oncology

## 2024-04-16 DIAGNOSIS — C8258 Diffuse follicle center lymphoma, lymph nodes of multiple sites: Secondary | ICD-10-CM

## 2024-04-16 NOTE — Progress Notes (Unsigned)
 " [facility] [phone]   [phone]  PROGRESS NOTE  Patient Care Team: Marvine Rush, MD as PCP - General (Family Medicine) Livingston Rigg, MD as Consulting Physician (Dermatology) Golden Forestine BROCKS, RN as Oncology Nurse Navigator (Medical Oncology)  Hematological/Oncological History # Follicular Lymphoma Stage III 06/27/2023: establish with [facility] clinic due to inguinal lymphadenopathy.  06/29/2023: CT C/A/P showed multiple enlarged lymph nodes in the chest, abdomen and pelvis.  Findings showed a right supraclavicular node measuring 12 x 22 mm, right axillary node 11 x 15 mm and hilar region 14 x 18 mm. 07/05/2023: needle biopsy of right inguinal lymph nodes shows low grade follicular lymphoma  01/11/2024-01/31/2024: Rituximab  weekly x 4 weeks  04/17/2024: start of maintenance rituximab  q 60 days.   Interval History:  Carolyn Massey 84 y.o. female with medical history significant for stage III follicular lymphoma who presents for a follow up visit. The patient's last visit was on 03/11/2024. In the interim since the last visit she completed rituximab  and underwent a post treatment PET CT scan.   On exam today Carolyn Massey reports she has been feeling well overall since her last visit 8 weeks ago.  She has had no issues with runny nose, sore throat, cough.  She denies any fevers, chills, sweats.  She reports her appetite is strong and she is gained 4 pounds in the interim since November.  She reports her energy today is about a 9 out of 10.  She denies any bumps or lumps concerning for lymphadenopathy.  She reports nothing else out of the ordinary.  She did recently receive a skin biopsy on her neck which showed squamous cell carcinoma and she is scheduled for a Mohs surgery on 05/21/2024.  Otherwise she has felt quite well and has no additional questions concerns or complaints today.  She is willing and able to continue with maintenance rituximab  at this time.  A  full 10 point ROS is otherwise negative.  MEDICAL HISTORY:  Past Medical History:  Diagnosis Date   Anemia    Arthritis    shoulder   Basal cell carcinoma 02/15/1994   right shoulder(CX35FU), post neck (CX35FU)   Basal cell carcinoma 01/17/2000   upper Left lip (CX35FU)   Basal cell carcinoma 02/12/2001   superificial-mid chest (CX35FU), behind right shoulder (CX35FU)   Basal cell carcinoma 06/21/2001   ulcerated- Left bulb of nose (CX35FU)   Basal cell carcinoma 10/28/2002   sclerosis- over Right brow-(MOHS), sclerosis- Left tip nose (MOHS)   Basal cell carcinoma 06/30/2003   superificial-RIght inner mid calf (CX35FU)   Basal cell carcinoma 08/15/2005   superificial-Right chest (CX35FU)   Basal cell carcinoma 09/22/2009   superificial-mid spinal (CX35FU), Right nose-(CX35FU)   Basal cell carcinoma 06/21/2011   superificial- Right upper chest (SRT)   Basal cell carcinoma 02/05/2015   above Left upper lip inferior (MOHS), alove Left upper lip superior (MOHS)   Basal cell carcinoma 06/06/2016   sup&nod-Left forehead-(CX35FU), nod-RIght mid back-(CX35FU)   Basal cell carcinoma 04/23/2019   superifcial-RIght scapula, sclerosis-Right chest superior   Nodular basal cell carcinoma (BCC) 06/15/2020   Left Forearm anterior   SCCA (squamous cell carcinoma) of skin 06/15/2020   Dorsum of Nose (in situ)   Squamous cell carcinoma of skin 09/22/2009   in situ-Right collarbone(CX35FU),  insitu-mid chest (CX35FU), insitu-Right tip of nose (CX35FU)   Squamous cell carcinoma of skin 10/09/2017   in situ-Right post shoulder-(CX35FU), in situ-Left cheek (CX35FU)   Squamous cell  carcinoma of skin 04/23/2019   insitu-Left upper arm, in situ-  below Right  knee outer    SURGICAL HISTORY: Past Surgical History:  Procedure Laterality Date   EYE SURGERY     to remove cataracts   MOHS SURGERY     x several over the years   SENTINEL NODE BIOPSY Right 07/05/2023   Procedure: BIOPSY, LYMPH NODE,  SENTINEL;  Surgeon: Dasie Leonor CROME, MD;  Location: [facility] OR;  Service: General;  Laterality: Right;   TOTAL ABDOMINAL HYSTERECTOMY      SOCIAL HISTORY: Social History   Socioeconomic History   Marital status: Married    Spouse name: Not on file   Number of children: Not on file   Years of education: Not on file   Highest education level: Not on file  Occupational History   Not on file  Tobacco Use   Smoking status: Never   Smokeless tobacco: Never  Vaping Use   Vaping status: Never Used  Substance and Sexual Activity   Alcohol use: Never   Drug use: Never   Sexual activity: Not Currently    Birth control/protection: Surgical    Comment: Hysterectomy  Other Topics Concern   Not on file  Social History Narrative   Not on file   Social Drivers of Health   Tobacco Use: Low Risk  (08/01/2023)   Received from [facility]   Patient History    Smoking Tobacco Use: Never    Smokeless Tobacco Use: Never    Passive Exposure: Not on file  Financial Resource Strain: Not on file  Food Insecurity: Not on file  Transportation Needs: Not on file  Physical Activity: Not on file  Stress: Not on file  Social Connections: Not on file  Intimate Partner Violence: Not on file  Depression (PHQ2-9): Low Risk (03/11/2024)   Depression (PHQ2-9)    PHQ-2 Score: 0  Alcohol Screen: Not on file  Housing: Unknown (07/04/2023)   Received [facility]   Epic    Unable to Pay for Housing in the Last Year: Not on file    Number of Times Moved in the Last Year: Not on file    At any time in the past 12 months, were you homeless or living in a shelter (including now)?: No  Utilities: Not on file  Health Literacy: Not on file    FAMILY HISTORY: No family history on file.  ALLERGIES:  is allergic to rituxan  [rituximab ].  MEDICATIONS:  Current Outpatient Medications  Medication Sig Dispense Refill   vitamin B-12 (CYANOCOBALAMIN) 500 MCG tablet Take 500  mcg by mouth daily.     No current facility-administered medications for this visit.   Facility-Administered Medications Ordered in Other Visits  Medication Dose Route Frequency Provider Last Rate Last Admin   0.9 %  sodium chloride  infusion   Intravenous Continuous Federico Norleen DASEN IV, MD       acetaminophen  (TYLENOL ) tablet 650 mg  650 mg Oral Once Kiyan Burmester T IV, MD       diphenhydrAMINE  (BENADRYL ) capsule 50 mg  50 mg Oral Once Scout Gumbs T IV, MD       riTUXimab -pvvr (RUXIENCE ) 600 mg in sodium chloride  0.9 % 250 mL (1.9355 mg/mL) infusion  375 mg/m2 (Treatment Plan Recorded) Intravenous Once Oletha Tolson T IV, MD        REVIEW OF SYSTEMS:   Constitutional: ( - ) fevers, ( - )  chills , ( - )  night sweats Eyes: ( - ) blurriness of vision, ( - ) double vision, ( - ) watery eyes Ears, nose, mouth, throat, and face: ( - ) mucositis, ( - ) sore throat Respiratory: ( - ) cough, ( - ) dyspnea, ( - ) wheezes Cardiovascular: ( - ) palpitation, ( - ) chest discomfort, ( - ) lower extremity swelling Gastrointestinal:  ( - ) nausea, ( - ) heartburn, ( - ) change in bowel habits Skin: ( - ) abnormal skin rashes Lymphatics: ( - ) new lymphadenopathy, ( - ) easy bruising Neurological: ( - ) numbness, ( - ) tingling, ( - ) new weaknesses Behavioral/Psych: ( - ) mood change, ( - ) new changes  All other systems were reviewed with the patient and are negative.  PHYSICAL EXAMINATION:  Vitals:   04/17/24 0931  BP: (!) 143/64  Pulse: 77  Resp: 17  Temp: 98.1 F (36.7 C)  SpO2: 100%    Filed Weights   04/17/24 0931  Weight: 117 lb 9.6 oz (53.3 kg)     GENERAL: Well-appearing elderly Caucasian female, alert, no distress and comfortable SKIN: skin color, texture, turgor are normal, no rashes or significant lesions EYES: conjunctiva are pink and non-injected, sclera clear LUNGS: clear to auscultation and percussion with normal breathing effort HEART: regular rate & rhythm and no  murmurs and no lower extremity edema GROIN: Well-healing incision site where right inguinal lymph node was removed. Musculoskeletal: no cyanosis of digits and no clubbing  PSYCH: alert & oriented x 3, fluent speech NEURO: no focal motor/sensory deficits  LABORATORY DATA:  I have reviewed the data as listed    Latest Ref Rng & Units 04/17/2024    9:07 AM 03/11/2024   10:41 AM 01/31/2024    7:42 AM  CBC  WBC 4.0 - 10.5 K/uL 3.9  4.6  5.5   Hemoglobin 12.0 - 15.0 g/dL 88.3  88.5  89.7   Hematocrit 36.0 - 46.0 % 34.7  34.2  31.4   Platelets 150 - 400 K/uL 165  173  171        Latest Ref Rng & Units 04/17/2024    9:07 AM 03/11/2024   10:41 AM 01/31/2024    7:42 AM  CMP  Glucose 70 - 99 mg/dL 877  93  82   BUN 8 - 23 mg/dL 16  20  21    Creatinine 0.44 - 1.00 mg/dL 8.93  9.06  9.06   Sodium 135 - 145 mmol/L 138  136  139   Potassium 3.5 - 5.1 mmol/L 3.9  4.5  4.6   Chloride 98 - 111 mmol/L 101  103  108   CO2 22 - 32 mmol/L 24  25  26    Calcium 8.9 - 10.3 mg/dL 9.5  9.6  9.3   Total Protein 6.5 - 8.1 g/dL 6.7  6.7  6.0   Total Bilirubin 0.0 - 1.2 mg/dL 0.3  0.3  0.5   Alkaline Phos 38 - 126 U/L 74  62  59   AST 15 - 41 U/L 29  28  19    ALT 0 - 44 U/L 11  11  8      No results found for: MPROTEIN No results found for: KPAFRELGTCHN, LAMBDASER, KAPLAMBRATIO  RADIOGRAPHIC STUDIES: No results found.    ASSESSMENT & PLAN Carolyn Massey 84 y.o. female with medical history significant for stage III follicular lymphoma who presents for a follow up visit.   Previously we discussed  the diagnosis of follicular lymphoma and steps moving forward.  We discussed that this tends to be a more indolent lymphoma and does not immediately require treatment unless there are issues with cytopenias, bulky disease, or symptoms.  We discussed criteria for treatment and the risk of progression.  The patient voiced understanding of our findings and plan moving forward.  GELF Criteria: 1.  Indication for treatment based on conglomerated mesenteric lymph nodes.  FLIPI Score: 3. High risk.   # Follicular Lymphoma Stage III --Findings at this time are consistent with a low-grade follicular lymphoma, stage III based on imaging. -- CT scan performed on 12/24/2023 showed clear evidence of bulky lymphadenopathy with enlarged lymph nodes, several greater than 3 cm and 1 large conglomeration greater than 10 cm.  Due to this the patient met criteria for treatment. PLAN:  --labs today show white blood cell 3.9, hemoglobin 1.6, MCV 81.1, platelets 165 --PET CT scan on 03/06/2024 showed a partial response to therapy with decreased size of lymph nodes and Deauville Score of 4. Next scan in June 2026  --Will plan to proceed with maintenance rituximab  every 2 months x 2 years.  Treatment today, next treatment in March 2026. --Patient voiced understanding of our findings and plan moving forward. --RTC for continued maintenance rituximab  in 8 weeks time    No orders of the defined types were placed in this encounter.   All questions were answered. The patient knows to call the clinic with any problems, questions or concerns.  A total of more than 30 minutes were spent on this encounter with face-to-face time and non-face-to-face time, including preparing to see the patient, ordering tests and/or medications, counseling the patient and coordination of care as outlined above.   Norleen IVAR Kidney, MD Department of Hematology/Oncology [facility] at [facility] Phone: [phone] Pager: [phone] Email: [email or web address]  04/17/2024 9:58 AM  "

## 2024-04-17 ENCOUNTER — Inpatient Hospital Stay: Attending: Physician Assistant | Admitting: Hematology and Oncology

## 2024-04-17 ENCOUNTER — Inpatient Hospital Stay

## 2024-04-17 VITALS — BP 126/79 | HR 76 | Temp 98.3°F | Resp 16

## 2024-04-17 VITALS — BP 143/64 | HR 77 | Temp 98.1°F | Resp 17 | Ht 65.0 in | Wt 117.6 lb

## 2024-04-17 DIAGNOSIS — Z5112 Encounter for antineoplastic immunotherapy: Secondary | ICD-10-CM | POA: Insufficient documentation

## 2024-04-17 DIAGNOSIS — Z79620 Long term (current) use of immunosuppressive biologic: Secondary | ICD-10-CM | POA: Insufficient documentation

## 2024-04-17 DIAGNOSIS — C8258 Diffuse follicle center lymphoma, lymph nodes of multiple sites: Secondary | ICD-10-CM

## 2024-04-17 DIAGNOSIS — R591 Generalized enlarged lymph nodes: Secondary | ICD-10-CM | POA: Diagnosis not present

## 2024-04-17 DIAGNOSIS — M1990 Unspecified osteoarthritis, unspecified site: Secondary | ICD-10-CM | POA: Diagnosis not present

## 2024-04-17 DIAGNOSIS — Z85828 Personal history of other malignant neoplasm of skin: Secondary | ICD-10-CM | POA: Insufficient documentation

## 2024-04-17 DIAGNOSIS — C8228 Follicular lymphoma grade III, unspecified, lymph nodes of multiple sites: Secondary | ICD-10-CM | POA: Diagnosis not present

## 2024-04-17 LAB — CBC WITH DIFFERENTIAL (CANCER CENTER ONLY)
Abs Immature Granulocytes: 0.05 10*3/uL (ref 0.00–0.07)
Basophils Absolute: 0 10*3/uL (ref 0.0–0.1)
Basophils Relative: 1 %
Eosinophils Absolute: 0.1 10*3/uL (ref 0.0–0.5)
Eosinophils Relative: 2 %
HCT: 34.7 % — ABNORMAL LOW (ref 36.0–46.0)
Hemoglobin: 11.6 g/dL — ABNORMAL LOW (ref 12.0–15.0)
Immature Granulocytes: 1 %
Lymphocytes Relative: 35 %
Lymphs Abs: 1.3 10*3/uL (ref 0.7–4.0)
MCH: 27.1 pg (ref 26.0–34.0)
MCHC: 33.4 g/dL (ref 30.0–36.0)
MCV: 81.1 fL (ref 80.0–100.0)
Monocytes Absolute: 0.4 10*3/uL (ref 0.1–1.0)
Monocytes Relative: 9 %
Neutro Abs: 2 10*3/uL (ref 1.7–7.7)
Neutrophils Relative %: 52 %
Platelet Count: 165 10*3/uL (ref 150–400)
RBC: 4.28 MIL/uL (ref 3.87–5.11)
RDW: 13.2 % (ref 11.5–15.5)
WBC Count: 3.9 10*3/uL — ABNORMAL LOW (ref 4.0–10.5)
nRBC: 0 % (ref 0.0–0.2)

## 2024-04-17 LAB — CMP (CANCER CENTER ONLY)
ALT: 11 U/L (ref 0–44)
AST: 29 U/L (ref 15–41)
Albumin: 4.5 g/dL (ref 3.5–5.0)
Alkaline Phosphatase: 74 U/L (ref 38–126)
Anion gap: 12 (ref 5–15)
BUN: 16 mg/dL (ref 8–23)
CO2: 24 mmol/L (ref 22–32)
Calcium: 9.5 mg/dL (ref 8.9–10.3)
Chloride: 101 mmol/L (ref 98–111)
Creatinine: 1.06 mg/dL — ABNORMAL HIGH (ref 0.44–1.00)
GFR, Estimated: 52 mL/min — ABNORMAL LOW
Glucose, Bld: 122 mg/dL — ABNORMAL HIGH (ref 70–99)
Potassium: 3.9 mmol/L (ref 3.5–5.1)
Sodium: 138 mmol/L (ref 135–145)
Total Bilirubin: 0.3 mg/dL (ref 0.0–1.2)
Total Protein: 6.7 g/dL (ref 6.5–8.1)

## 2024-04-17 LAB — LACTATE DEHYDROGENASE: LDH: 345 U/L — ABNORMAL HIGH (ref 105–235)

## 2024-04-17 MED ORDER — ACETAMINOPHEN 325 MG PO TABS
650.0000 mg | ORAL_TABLET | Freq: Once | ORAL | Status: AC
  Administered 2024-04-17: 650 mg via ORAL
  Filled 2024-04-17: qty 2

## 2024-04-17 MED ORDER — SODIUM CHLORIDE 0.9 % IV SOLN: INTRAVENOUS | Status: DC

## 2024-04-17 MED ORDER — CETIRIZINE HCL 10 MG PO TABS
10.0000 mg | ORAL_TABLET | Freq: Once | ORAL | Status: AC
  Administered 2024-04-17: 10 mg via ORAL
  Filled 2024-04-17: qty 1

## 2024-04-17 MED ORDER — SODIUM CHLORIDE 0.9 % IV SOLN
375.0000 mg/m2 | Freq: Once | INTRAVENOUS | Status: AC
  Administered 2024-04-17: 600 mg via INTRAVENOUS
  Filled 2024-04-17: qty 50

## 2024-04-17 MED ORDER — METHYLPREDNISOLONE SODIUM SUCC 125 MG IJ SOLR
62.5000 mg | Freq: Once | INTRAMUSCULAR | Status: AC
  Administered 2024-04-17: 62.5 mg via INTRAVENOUS
  Filled 2024-04-17: qty 2

## 2024-04-17 MED ORDER — FAMOTIDINE IN NACL 20-0.9 MG/50ML-% IV SOLN
20.0000 mg | Freq: Once | INTRAVENOUS | Status: AC
  Administered 2024-04-17: 20 mg via INTRAVENOUS
  Filled 2024-04-17: qty 50

## 2024-04-17 MED ORDER — DIPHENHYDRAMINE HCL 25 MG PO CAPS: 50.0000 mg | ORAL_CAPSULE | Freq: Once | ORAL | Status: DC

## 2024-04-17 NOTE — Patient Instructions (Signed)
 [facility] - A DEPT OF [place]  Discharge Instructions: Thank you for choosing [facility] to provide your oncology and hematology care.   If you have a lab appointment with the Cancer Center, please go directly to the Cancer Center and check in at the registration area.   Wear comfortable clothing and clothing appropriate for easy access to any Portacath or PICC line.   We strive to give you quality time with your provider. You may need to reschedule your appointment if you arrive late (15 or more minutes).  Arriving late affects you and other patients whose appointments are after yours.  Also, if you miss three or more appointments without notifying the office, you may be dismissed from the clinic at the provider's discretion.      For prescription refill requests, have your pharmacy contact our office and allow 72 hours for refills to be completed.    Today you received the following chemotherapy and/or immunotherapy agents: riTUXimab -pvvr (RUXIENCE )     To help prevent nausea and vomiting after your treatment, we encourage you to take your nausea medication as directed.  BELOW ARE SYMPTOMS THAT SHOULD BE REPORTED IMMEDIATELY: *FEVER GREATER THAN 100.4 F (38 C) OR HIGHER *CHILLS OR SWEATING *NAUSEA AND VOMITING THAT IS NOT CONTROLLED WITH YOUR NAUSEA MEDICATION *UNUSUAL SHORTNESS OF BREATH *UNUSUAL BRUISING OR BLEEDING *URINARY PROBLEMS (pain or burning when urinating, or frequent urination) *BOWEL PROBLEMS (unusual diarrhea, constipation, pain near the anus) TENDERNESS IN MOUTH AND THROAT WITH OR WITHOUT PRESENCE OF ULCERS (sore throat, sores in mouth, or a toothache) UNUSUAL RASH, SWELLING OR PAIN  UNUSUAL VAGINAL DISCHARGE OR ITCHING   Items with * indicate a potential emergency and should be followed up as soon as possible or go to the Emergency Department if any problems should occur.  Please show the CHEMOTHERAPY ALERT CARD or  IMMUNOTHERAPY ALERT CARD at check-in to the Emergency Department and triage nurse.  Should you have questions after your visit or need to cancel or reschedule your appointment, please contact [facility] - A DEPT OF [facility]  Dept: [phone]  and follow the prompts.  Office hours are 8:00 a.m. to 4:30 p.m. Monday - Friday. Please note that voicemails left after 4:00 p.m. may not be returned until the following business day.  We are closed weekends and major holidays. You have access to a nurse at all times for urgent questions. Please call the main number to the clinic Dept: [phone] and follow the prompts.   For any non-urgent questions, you may also contact your provider using MyChart. We now offer e-Visits for anyone 65 and older to request care online for non-urgent symptoms. For details visit [email or web address].   Also download the MyChart app! Go to the app store, search "MyChart", open the app, select [place], and log in with your MyChart username and password.

## 2024-04-17 NOTE — Progress Notes (Signed)
 Premeds updated to same premeds received with previous rituximab  infusions per Dr. Federico.  Saleha Kalp, PharmD, MBA

## 2024-04-18 ENCOUNTER — Other Ambulatory Visit: Payer: Self-pay

## 2024-04-18 ENCOUNTER — Encounter: Payer: Self-pay | Admitting: Hematology and Oncology

## 2024-04-18 ENCOUNTER — Telehealth: Payer: Self-pay | Admitting: *Deleted

## 2024-04-18 ENCOUNTER — Encounter: Payer: Self-pay | Admitting: Physician Assistant

## 2024-04-18 NOTE — Telephone Encounter (Signed)
 TCT patient regarding symptoms of a ed , swollen, itchy face after her 1st Rituxan  treatment yesterday. Spoke with her daughter, June. She states the symptoms are gradually resolving with Zyrtec . Denies fever, itching is much better. Advised it is ok tyo give the zyrtec  daily while she has the redness. Benadryl  25 mg at night if needed. June voiced understanding. June states she is feeling better about the situation overall. She verbalizes understanding to call with further questions and concerns.

## 2024-04-22 ENCOUNTER — Other Ambulatory Visit: Payer: Self-pay | Admitting: Hematology and Oncology

## 2024-04-23 ENCOUNTER — Other Ambulatory Visit: Payer: Self-pay

## 2024-06-09 ENCOUNTER — Inpatient Hospital Stay

## 2024-06-09 ENCOUNTER — Inpatient Hospital Stay: Attending: Physician Assistant | Admitting: Hematology and Oncology

## 2024-08-07 ENCOUNTER — Inpatient Hospital Stay

## 2024-08-07 ENCOUNTER — Inpatient Hospital Stay: Attending: Physician Assistant | Admitting: Hematology and Oncology

## 2024-10-06 ENCOUNTER — Inpatient Hospital Stay

## 2024-10-06 ENCOUNTER — Inpatient Hospital Stay: Attending: Physician Assistant | Admitting: Hematology and Oncology
